# Patient Record
Sex: Male | Born: 1955 | Race: Black or African American | Hispanic: No | Smoking: Current every day smoker
Health system: Southern US, Community
[De-identification: ages and names within clinical notes are randomized; demographics above are authoritative.]

## PROBLEM LIST (undated history)

## (undated) DIAGNOSIS — F32A Depression, unspecified: Secondary | ICD-10-CM

## (undated) DIAGNOSIS — F329 Major depressive disorder, single episode, unspecified: Secondary | ICD-10-CM

## (undated) DIAGNOSIS — S2239XA Fracture of one rib, unspecified side, initial encounter for closed fracture: Secondary | ICD-10-CM

## (undated) DIAGNOSIS — I10 Essential (primary) hypertension: Secondary | ICD-10-CM

## (undated) DIAGNOSIS — S02609A Fracture of mandible, unspecified, initial encounter for closed fracture: Secondary | ICD-10-CM

## (undated) DIAGNOSIS — F1910 Other psychoactive substance abuse, uncomplicated: Secondary | ICD-10-CM

## (undated) DIAGNOSIS — S62101A Fracture of unspecified carpal bone, right wrist, initial encounter for closed fracture: Secondary | ICD-10-CM

## (undated) DIAGNOSIS — F1010 Alcohol abuse, uncomplicated: Secondary | ICD-10-CM

## (undated) DIAGNOSIS — D649 Anemia, unspecified: Secondary | ICD-10-CM

## (undated) HISTORY — DX: Major depressive disorder, single episode, unspecified: F32.9

## (undated) HISTORY — DX: Alcohol abuse, uncomplicated: F10.10

## (undated) HISTORY — DX: Other psychoactive substance abuse, uncomplicated: F19.10

## (undated) HISTORY — DX: Depression, unspecified: F32.A

---

## 2015-09-13 ENCOUNTER — Emergency Department (HOSPITAL_COMMUNITY): Payer: Medicaid Other

## 2015-09-13 ENCOUNTER — Inpatient Hospital Stay (HOSPITAL_COMMUNITY)
Admission: EM | Admit: 2015-09-13 | Discharge: 2015-09-19 | DRG: 957 | Disposition: A | Discharge status: Skilled Nursing Facility | Payer: Medicaid Other | Attending: Otolaryngology | Admitting: Otolaryngology

## 2015-09-13 ENCOUNTER — Encounter (HOSPITAL_COMMUNITY): Payer: Self-pay

## 2015-09-13 ENCOUNTER — Ambulatory Visit: Payer: Self-pay | Admitting: Otolaryngology

## 2015-09-13 DIAGNOSIS — F17210 Nicotine dependence, cigarettes, uncomplicated: Secondary | ICD-10-CM | POA: Diagnosis present

## 2015-09-13 DIAGNOSIS — S02652A Fracture of angle of left mandible, initial encounter for closed fracture: Secondary | ICD-10-CM | POA: Diagnosis present

## 2015-09-13 DIAGNOSIS — N179 Acute kidney failure, unspecified: Secondary | ICD-10-CM | POA: Diagnosis present

## 2015-09-13 DIAGNOSIS — S02609B Fracture of mandible, unspecified, initial encounter for open fracture: Secondary | ICD-10-CM

## 2015-09-13 DIAGNOSIS — D62 Acute posthemorrhagic anemia: Secondary | ICD-10-CM | POA: Diagnosis not present

## 2015-09-13 DIAGNOSIS — I493 Ventricular premature depolarization: Secondary | ICD-10-CM | POA: Diagnosis present

## 2015-09-13 DIAGNOSIS — S0181XA Laceration without foreign body of other part of head, initial encounter: Secondary | ICD-10-CM | POA: Diagnosis present

## 2015-09-13 DIAGNOSIS — S2243XA Multiple fractures of ribs, bilateral, initial encounter for closed fracture: Secondary | ICD-10-CM | POA: Diagnosis present

## 2015-09-13 DIAGNOSIS — I110 Hypertensive heart disease with heart failure: Secondary | ICD-10-CM | POA: Diagnosis present

## 2015-09-13 DIAGNOSIS — T17808A Unspecified foreign body in other parts of respiratory tract causing other injury, initial encounter: Secondary | ICD-10-CM | POA: Diagnosis present

## 2015-09-13 DIAGNOSIS — S02401A Maxillary fracture, unspecified, initial encounter for closed fracture: Secondary | ICD-10-CM | POA: Diagnosis present

## 2015-09-13 DIAGNOSIS — I4892 Unspecified atrial flutter: Secondary | ICD-10-CM | POA: Diagnosis not present

## 2015-09-13 DIAGNOSIS — S0242XA Fracture of alveolus of maxilla, initial encounter for closed fracture: Secondary | ICD-10-CM | POA: Diagnosis present

## 2015-09-13 DIAGNOSIS — I7101 Dissection of thoracic aorta: Secondary | ICD-10-CM | POA: Diagnosis present

## 2015-09-13 DIAGNOSIS — S62121A Displaced fracture of lunate [semilunar], right wrist, initial encounter for closed fracture: Secondary | ICD-10-CM | POA: Diagnosis present

## 2015-09-13 DIAGNOSIS — S02601A Fracture of unspecified part of body of right mandible, initial encounter for closed fracture: Principal | ICD-10-CM | POA: Diagnosis present

## 2015-09-13 DIAGNOSIS — Z23 Encounter for immunization: Secondary | ICD-10-CM

## 2015-09-13 DIAGNOSIS — E876 Hypokalemia: Secondary | ICD-10-CM | POA: Diagnosis present

## 2015-09-13 DIAGNOSIS — I472 Ventricular tachycardia: Secondary | ICD-10-CM | POA: Diagnosis not present

## 2015-09-13 DIAGNOSIS — S02602A Fracture of unspecified part of body of left mandible, initial encounter for closed fracture: Secondary | ICD-10-CM | POA: Diagnosis present

## 2015-09-13 DIAGNOSIS — I10 Essential (primary) hypertension: Secondary | ICD-10-CM | POA: Diagnosis present

## 2015-09-13 DIAGNOSIS — T17908A Unspecified foreign body in respiratory tract, part unspecified causing other injury, initial encounter: Secondary | ICD-10-CM

## 2015-09-13 DIAGNOSIS — Y903 Blood alcohol level of 60-79 mg/100 ml: Secondary | ICD-10-CM | POA: Diagnosis present

## 2015-09-13 DIAGNOSIS — I509 Heart failure, unspecified: Secondary | ICD-10-CM | POA: Diagnosis present

## 2015-09-13 DIAGNOSIS — I71019 Dissection of thoracic aorta, unspecified: Secondary | ICD-10-CM | POA: Diagnosis present

## 2015-09-13 DIAGNOSIS — S36031A Moderate laceration of spleen, initial encounter: Secondary | ICD-10-CM | POA: Diagnosis present

## 2015-09-13 DIAGNOSIS — Z590 Homelessness: Secondary | ICD-10-CM

## 2015-09-13 DIAGNOSIS — I4891 Unspecified atrial fibrillation: Secondary | ICD-10-CM | POA: Diagnosis not present

## 2015-09-13 DIAGNOSIS — S02609A Fracture of mandible, unspecified, initial encounter for closed fracture: Secondary | ICD-10-CM | POA: Diagnosis present

## 2015-09-13 DIAGNOSIS — R9431 Abnormal electrocardiogram [ECG] [EKG]: Secondary | ICD-10-CM | POA: Insufficient documentation

## 2015-09-13 DIAGNOSIS — S32311A Displaced avulsion fracture of right ilium, initial encounter for closed fracture: Secondary | ICD-10-CM | POA: Diagnosis present

## 2015-09-13 DIAGNOSIS — I7100 Dissection of unspecified site of aorta: Secondary | ICD-10-CM

## 2015-09-13 DIAGNOSIS — S32309A Unspecified fracture of unspecified ilium, initial encounter for closed fracture: Secondary | ICD-10-CM | POA: Diagnosis present

## 2015-09-13 DIAGNOSIS — R51 Headache: Secondary | ICD-10-CM | POA: Diagnosis present

## 2015-09-13 DIAGNOSIS — I959 Hypotension, unspecified: Secondary | ICD-10-CM | POA: Diagnosis present

## 2015-09-13 DIAGNOSIS — F10129 Alcohol abuse with intoxication, unspecified: Secondary | ICD-10-CM | POA: Diagnosis present

## 2015-09-13 DIAGNOSIS — I7103 Dissection of thoracoabdominal aorta: Secondary | ICD-10-CM | POA: Diagnosis present

## 2015-09-13 DIAGNOSIS — S2249XA Multiple fractures of ribs, unspecified side, initial encounter for closed fracture: Secondary | ICD-10-CM

## 2015-09-13 HISTORY — DX: Essential (primary) hypertension: I10

## 2015-09-13 LAB — I-STAT CHEM 8, ED
BUN: 14 mg/dL (ref 6–20)
CHLORIDE: 106 mmol/L (ref 101–111)
CREATININE: 1.6 mg/dL — AB (ref 0.61–1.24)
Calcium, Ion: 1.04 mmol/L — ABNORMAL LOW (ref 1.13–1.30)
Glucose, Bld: 97 mg/dL (ref 65–99)
HEMATOCRIT: 42 % (ref 39.0–52.0)
Hemoglobin: 14.3 g/dL (ref 13.0–17.0)
Potassium: 3.2 mmol/L — ABNORMAL LOW (ref 3.5–5.1)
SODIUM: 141 mmol/L (ref 135–145)
TCO2: 19 mmol/L (ref 0–100)

## 2015-09-13 LAB — CBC WITH DIFFERENTIAL/PLATELET
BASOS ABS: 0 10*3/uL (ref 0.0–0.1)
BASOS PCT: 0 %
EOS PCT: 1 %
Eosinophils Absolute: 0.1 10*3/uL (ref 0.0–0.7)
HCT: 39.6 % (ref 39.0–52.0)
Hemoglobin: 12.7 g/dL — ABNORMAL LOW (ref 13.0–17.0)
LYMPHS PCT: 10 %
Lymphs Abs: 1.1 10*3/uL (ref 0.7–4.0)
MCH: 29.2 pg (ref 26.0–34.0)
MCHC: 32.1 g/dL (ref 30.0–36.0)
MCV: 91 fL (ref 78.0–100.0)
MONO ABS: 0.3 10*3/uL (ref 0.1–1.0)
MONOS PCT: 3 %
NEUTROS ABS: 9.4 10*3/uL — AB (ref 1.7–7.7)
Neutrophils Relative %: 86 %
PLATELETS: 153 10*3/uL (ref 150–400)
RBC: 4.35 MIL/uL (ref 4.22–5.81)
RDW: 12.7 % (ref 11.5–15.5)
WBC: 11 10*3/uL — AB (ref 4.0–10.5)

## 2015-09-13 LAB — COMPREHENSIVE METABOLIC PANEL
ALBUMIN: 3.1 g/dL — AB (ref 3.5–5.0)
ALK PHOS: 44 U/L (ref 38–126)
ALT: 102 U/L — ABNORMAL HIGH (ref 17–63)
AST: 254 U/L — AB (ref 15–41)
Anion gap: 14 (ref 5–15)
BILIRUBIN TOTAL: 0.4 mg/dL (ref 0.3–1.2)
BUN: 14 mg/dL (ref 6–20)
CALCIUM: 8.5 mg/dL — AB (ref 8.9–10.3)
CO2: 17 mmol/L — ABNORMAL LOW (ref 22–32)
CREATININE: 1.48 mg/dL — AB (ref 0.61–1.24)
Chloride: 107 mmol/L (ref 101–111)
GFR calc Af Amer: 58 mL/min — ABNORMAL LOW (ref >60)
GFR, EST NON AFRICAN AMERICAN: 50 mL/min — AB (ref >60)
GLUCOSE: 102 mg/dL — AB (ref 65–99)
POTASSIUM: 3.1 mmol/L — AB (ref 3.5–5.1)
Sodium: 138 mmol/L (ref 135–145)
TOTAL PROTEIN: 6.3 g/dL — AB (ref 6.5–8.1)

## 2015-09-13 LAB — URINALYSIS, ROUTINE W REFLEX MICROSCOPIC
BILIRUBIN URINE: NEGATIVE
Bilirubin Urine: NEGATIVE
GLUCOSE, UA: NEGATIVE mg/dL
Glucose, UA: NEGATIVE mg/dL
Hgb urine dipstick: NEGATIVE
KETONES UR: NEGATIVE mg/dL
KETONES UR: 15 mg/dL — AB
LEUKOCYTES UA: NEGATIVE
Leukocytes, UA: NEGATIVE
NITRITE: NEGATIVE
Nitrite: NEGATIVE
PH: 5.5 (ref 5.0–8.0)
PH: 5 (ref 5.0–8.0)
PROTEIN: 30 mg/dL — AB
Protein, ur: NEGATIVE mg/dL
SPECIFIC GRAVITY, URINE: 1.041 — AB (ref 1.005–1.030)
Specific Gravity, Urine: 1.031 — ABNORMAL HIGH (ref 1.005–1.030)

## 2015-09-13 LAB — I-STAT CG4 LACTIC ACID, ED: Lactic Acid, Venous: 6.98 mmol/L (ref 0.5–2.0)

## 2015-09-13 LAB — LACTIC ACID, PLASMA: Lactic Acid, Venous: 1.3 mmol/L (ref 0.5–2.0)

## 2015-09-13 LAB — URINE MICROSCOPIC-ADD ON
BACTERIA UA: NONE SEEN
RBC / HPF: NONE SEEN RBC/hpf (ref 0–5)
WBC UA: NONE SEEN WBC/hpf (ref 0–5)

## 2015-09-13 LAB — ETHANOL: ALCOHOL ETHYL (B): 73 mg/dL — AB (ref <5)

## 2015-09-13 LAB — PROTIME-INR
INR: 1.34 (ref 0.00–1.49)
PROTHROMBIN TIME: 16.7 s — AB (ref 11.6–15.2)

## 2015-09-13 LAB — ABO/RH: ABO/RH(D): O POS

## 2015-09-13 LAB — CDS SEROLOGY

## 2015-09-13 MED ORDER — FENTANYL CITRATE (PF) 100 MCG/2ML IJ SOLN
50.0000 ug | INTRAMUSCULAR | Status: DC | PRN
  Administered 2015-09-13 – 2015-09-18 (×12): 50 ug via INTRAVENOUS
  Filled 2015-09-13 (×11): qty 2

## 2015-09-13 MED ORDER — ONDANSETRON HCL 4 MG PO TABS: 4.0000 mg | ORAL_TABLET | Freq: Four times a day (QID) | ORAL | Status: DC | PRN

## 2015-09-13 MED ORDER — SODIUM CHLORIDE 0.9 % IV BOLUS (SEPSIS)
1000.0000 mL | Freq: Once | INTRAVENOUS | Status: AC
  Administered 2015-09-13: 1000 mL via INTRAVENOUS

## 2015-09-13 MED ORDER — CEFAZOLIN SODIUM 1-5 GM-% IV SOLN
1.0000 g | Freq: Once | INTRAVENOUS | Status: AC
Start: 2015-09-13 — End: 2015-09-13
  Administered 2015-09-13: 1 g via INTRAVENOUS
  Filled 2015-09-13: qty 50

## 2015-09-13 MED ORDER — LABETALOL HCL 5 MG/ML IV SOLN
0.5000 mg/min | INTRAVENOUS | Status: DC
  Administered 2015-09-13 – 2015-09-15 (×2): 0.5 mg/min via INTRAVENOUS
  Administered 2015-09-15 – 2015-09-16 (×2): 1.5 mg/min via INTRAVENOUS
  Filled 2015-09-13 (×4): qty 100

## 2015-09-13 MED ORDER — ONDANSETRON HCL 4 MG/2ML IJ SOLN: 4.0000 mg | Freq: Four times a day (QID) | INTRAMUSCULAR | Status: DC | PRN

## 2015-09-13 MED ORDER — PANTOPRAZOLE SODIUM 40 MG PO TBEC
40.0000 mg | DELAYED_RELEASE_TABLET | Freq: Two times a day (BID) | ORAL | Status: DC
  Administered 2015-09-17 – 2015-09-18 (×3): 40 mg via ORAL
  Filled 2015-09-13 (×4): qty 1

## 2015-09-13 MED ORDER — FAMOTIDINE IN NACL 20-0.9 MG/50ML-% IV SOLN
20.0000 mg | Freq: Two times a day (BID) | INTRAVENOUS | Status: DC
  Administered 2015-09-14 – 2015-09-17 (×6): 20 mg via INTRAVENOUS
  Filled 2015-09-13 (×12): qty 50

## 2015-09-13 MED ORDER — ENOXAPARIN SODIUM 30 MG/0.3ML ~~LOC~~ SOLN
30.0000 mg | Freq: Two times a day (BID) | SUBCUTANEOUS | Status: DC
  Administered 2015-09-14 – 2015-09-19 (×11): 30 mg via SUBCUTANEOUS
  Filled 2015-09-13 (×11): qty 0.3

## 2015-09-13 MED ORDER — HYDROMORPHONE HCL 1 MG/ML IJ SOLN
0.5000 mg | INTRAMUSCULAR | Status: DC | PRN
  Administered 2015-09-14 – 2015-09-18 (×18): 0.5 mg via INTRAVENOUS
  Filled 2015-09-13 (×18): qty 1

## 2015-09-13 MED ORDER — IOPAMIDOL (ISOVUE-370) INJECTION 76%
INTRAVENOUS | Status: AC
  Administered 2015-09-13: 100 mL
  Filled 2015-09-13: qty 100

## 2015-09-13 MED ORDER — ACETAMINOPHEN 325 MG PO TABS: 650.0000 mg | ORAL_TABLET | ORAL | Status: DC | PRN

## 2015-09-13 MED ORDER — TETANUS-DIPHTH-ACELL PERTUSSIS 5-2.5-18.5 LF-MCG/0.5 IM SUSP
INTRAMUSCULAR | Status: AC
  Administered 2015-09-13: 0.5 mL via INTRAMUSCULAR
  Filled 2015-09-13: qty 0.5

## 2015-09-13 MED ORDER — CEFAZOLIN SODIUM-DEXTROSE 2-4 GM/100ML-% IV SOLN
INTRAVENOUS | Status: AC
  Filled 2015-09-13: qty 100

## 2015-09-13 MED ORDER — FENTANYL CITRATE (PF) 100 MCG/2ML IJ SOLN
INTRAMUSCULAR | Status: AC
  Administered 2015-09-13: 50 ug via INTRAVENOUS
  Filled 2015-09-13: qty 2

## 2015-09-13 MED ORDER — LIDOCAINE-EPINEPHRINE 1 %-1:100000 IJ SOLN
30.0000 mL | Freq: Once | INTRAMUSCULAR | Status: AC
  Administered 2015-09-13: 30 mL via INTRADERMAL
  Filled 2015-09-13: qty 1

## 2015-09-13 MED ORDER — TETANUS-DIPHTHERIA TOXOIDS TD 5-2 LFU IM INJ
0.5000 mL | INJECTION | Freq: Once | INTRAMUSCULAR | Status: DC
  Filled 2015-09-13: qty 0.5

## 2015-09-13 MED ORDER — FENTANYL CITRATE (PF) 100 MCG/2ML IJ SOLN: 50.0000 ug | Freq: Once | INTRAMUSCULAR | Status: DC

## 2015-09-13 MED ORDER — SODIUM CHLORIDE 0.9 % IV SOLN
INTRAVENOUS | Status: DC
  Administered 2015-09-13 – 2015-09-14 (×2): via INTRAVENOUS
  Administered 2015-09-14: 1000 mL via INTRAVENOUS
  Administered 2015-09-16 – 2015-09-17 (×2): via INTRAVENOUS

## 2015-09-13 MED ORDER — SODIUM CHLORIDE 0.9 % IV SOLN
INTRAVENOUS | Status: DC | PRN
  Administered 2015-09-17: 10 mL/h via INTRA_ARTERIAL
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
## 2015-09-13 NOTE — ED Notes (Signed)
Vista aspen collar applied to pt.

## 2015-09-13 NOTE — Procedures (Signed)
Arterial Catheter Insertion Procedure Note Luke Cook [id number] Apr 09, 1955  Procedure: Insertion of Arterial Catheter  Indications: Blood pressure monitoring  Procedure Details Consent: Unable to obtain consent because of altered level of consciousness. Time Out: Verified patient identification, verified procedure, site/side was marked, verified correct patient position, special equipment/implants available, medications/allergies/relevent history reviewed, required imaging and test results available.  Performed  Maximum sterile technique was used including antiseptics, cap, gloves, gown, hand hygiene and mask. Skin prep: Chlorhexidine; local anesthetic administered 20 gauge catheter was inserted into left radial artery using the Seldinger technique.  Evaluation Blood flow good; BP tracing good. Complications: No apparent complications.   [facility] 09/13/2015

## 2015-09-13 NOTE — Progress Notes (Signed)
Orthopedic Tech Progress Note Patient Details:  Luke Cook [birth year] [id number]  Ortho Devices Type of Ortho Device: Ace wrap, Velcro wrist splint Ortho Device/Splint Location: RUE Ortho Device/Splint Interventions: Ordered, Application   Jennye MoccasinHughes, [place] Kellison [name] 09/13/2015, 7:46 PM
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
## 2015-09-13 NOTE — ED Notes (Signed)
Dr. Pollyann Kennedyosen at the bedside
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
## 2015-09-13 NOTE — Consult Note (Signed)
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 mandibular body fracture involving multiple lower anterior teeth. 2. Left posterior mandibular body fracture. 3. Left maxilla fracture involving multiple tooth roots and 1 displaced tooth. 4. No skull fracture or intracranial hemorrhage. 5. No cervical spine fracture or subluxation. 6. 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 7. Cervical spine degenerative changes. Electronically Signed   By: Beckie SaltsSteven  Reid M.D.   On: 09/13/2015 17:44     ASSESSMENT / PLAN:  Foreign Body in RLL: The object appears metallic, likely a dental filling, and appears to be in the distal portion of the medial segment of the RLL. An attempt should be made at removal, but the greatest risk it imposes is post-obstructive pneumonia, and its removal is not urgent. Conventional flexible bronchoscopy is not likely to be successful in retrieving the object, and may require rigid bronchoscopy. However, if the patient is going to be intubated for surgery, if a large tube is used (8.0 ETT, recognize this is not likely if he is still on C-spine precautions), an attempt with video flexible bronchoscopy with a device such as an Arndt bronchial blocker to remove the object.  Plan to discuss with the daytime pulmonary medicine consult  service to further ascertain what other options may be available. Consultation with thoracic surgery may also be of benefit.   Jamie KatoAaron Michaela Broski, MD Pulmonary and Critical Care Medicine [facility] Pager: [phone]  09/13/2015, 10:39 PM

## 2015-09-13 NOTE — ED Notes (Signed)
Patient transported to CT 

## 2015-09-13 NOTE — ED Notes (Signed)
Dr Wakefield at the bedside.  

## 2015-09-13 NOTE — ED Notes (Signed)
RT notified to start pt.'s a-line and will start labetalol drip.

## 2015-09-13 NOTE — ED Notes (Signed)
Ortho tech applied right wrist splint at bedside .
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
## 2015-09-14 ENCOUNTER — Encounter (HOSPITAL_COMMUNITY): Admission: EM | Disposition: A | Discharge status: Skilled Nursing Facility | Payer: Self-pay | Source: Home / Self Care

## 2015-09-14 ENCOUNTER — Inpatient Hospital Stay (HOSPITAL_COMMUNITY): Payer: Medicaid Other | Admitting: Anesthesiology

## 2015-09-14 DIAGNOSIS — T17908A Unspecified foreign body in respiratory tract, part unspecified causing other injury, initial encounter: Secondary | ICD-10-CM

## 2015-09-14 HISTORY — PX: ORIF MANDIBULAR FRACTURE: SHX2127

## 2015-09-14 HISTORY — PX: FACIAL LACERATION REPAIR: SHX6589

## 2015-09-14 LAB — BASIC METABOLIC PANEL
Anion gap: 5 (ref 5–15)
BUN: 18 mg/dL (ref 6–20)
CALCIUM: 7.8 mg/dL — AB (ref 8.9–10.3)
CO2: 24 mmol/L (ref 22–32)
Chloride: 112 mmol/L — ABNORMAL HIGH (ref 101–111)
Creatinine, Ser: 1.41 mg/dL — ABNORMAL HIGH (ref 0.61–1.24)
GFR calc non Af Amer: 53 mL/min — ABNORMAL LOW (ref >60)
Glucose, Bld: 127 mg/dL — ABNORMAL HIGH (ref 65–99)
Potassium: 4.2 mmol/L (ref 3.5–5.1)
SODIUM: 141 mmol/L (ref 135–145)

## 2015-09-14 LAB — CBC
HCT: 31.6 % — ABNORMAL LOW (ref 39.0–52.0)
HEMOGLOBIN: 10 g/dL — AB (ref 13.0–17.0)
MCH: 29.1 pg (ref 26.0–34.0)
MCHC: 31.6 g/dL (ref 30.0–36.0)
MCV: 91.9 fL (ref 78.0–100.0)
Platelets: 134 10*3/uL — ABNORMAL LOW (ref 150–400)
RBC: 3.44 MIL/uL — AB (ref 4.22–5.81)
RDW: 13.2 % (ref 11.5–15.5)
WBC: 11.1 10*3/uL — ABNORMAL HIGH (ref 4.0–10.5)

## 2015-09-14 LAB — MRSA PCR SCREENING: MRSA BY PCR: NEGATIVE

## 2015-09-14 SURGERY — OPEN REDUCTION INTERNAL FIXATION (ORIF) MANDIBULAR FRACTURE
Anesthesia: General | Site: Mouth

## 2015-09-14 MED ORDER — NEOSTIGMINE METHYLSULFATE 10 MG/10ML IV SOLN
INTRAVENOUS | Status: DC | PRN
  Administered 2015-09-14: 2 mg via INTRAVENOUS

## 2015-09-14 MED ORDER — MIDAZOLAM HCL 2 MG/2ML IJ SOLN
INTRAMUSCULAR | Status: AC
  Filled 2015-09-14: qty 2

## 2015-09-14 MED ORDER — PHENYLEPHRINE HCL 10 MG/ML IJ SOLN
10.0000 mg | INTRAMUSCULAR | Status: DC | PRN
  Administered 2015-09-14: 60 ug/min via INTRAVENOUS

## 2015-09-14 MED ORDER — LIDOCAINE HCL (CARDIAC) 20 MG/ML IV SOLN
INTRAVENOUS | Status: DC | PRN
  Administered 2015-09-14: 50 mg via INTRATRACHEAL

## 2015-09-14 MED ORDER — DEXAMETHASONE SODIUM PHOSPHATE 10 MG/ML IJ SOLN
INTRAMUSCULAR | Status: AC
  Filled 2015-09-14: qty 1

## 2015-09-14 MED ORDER — FENTANYL CITRATE (PF) 250 MCG/5ML IJ SOLN
INTRAMUSCULAR | Status: AC
  Filled 2015-09-14: qty 5

## 2015-09-14 MED ORDER — EPHEDRINE SULFATE 50 MG/ML IJ SOLN
INTRAMUSCULAR | Status: DC | PRN
  Administered 2015-09-14: 15 mg via INTRAVENOUS

## 2015-09-14 MED ORDER — HYDROMORPHONE HCL 1 MG/ML IJ SOLN
0.2500 mg | INTRAMUSCULAR | Status: DC | PRN
  Administered 2015-09-14 (×2): 0.5 mg via INTRAVENOUS

## 2015-09-14 MED ORDER — LABETALOL HCL 5 MG/ML IV SOLN
INTRAVENOUS | Status: DC | PRN
  Administered 2015-09-14: 20 mg via INTRAVENOUS

## 2015-09-14 MED ORDER — PHENYLEPHRINE HCL 10 MG/ML IJ SOLN
INTRAMUSCULAR | Status: DC | PRN
  Administered 2015-09-14 (×2): 80 ug via INTRAVENOUS

## 2015-09-14 MED ORDER — 0.9 % SODIUM CHLORIDE (POUR BTL) OPTIME
TOPICAL | Status: DC | PRN
  Administered 2015-09-14: 1000 mL

## 2015-09-14 MED ORDER — LIDOCAINE 2% (20 MG/ML) 5 ML SYRINGE
INTRAMUSCULAR | Status: AC
  Filled 2015-09-14: qty 5

## 2015-09-14 MED ORDER — BACITRACIN ZINC 500 UNIT/GM EX OINT
TOPICAL_OINTMENT | CUTANEOUS | Status: DC | PRN
  Administered 2015-09-14: 1 via TOPICAL

## 2015-09-14 MED ORDER — PROPOFOL 10 MG/ML IV BOLUS
INTRAVENOUS | Status: DC | PRN
  Administered 2015-09-14: 170 mg via INTRAVENOUS

## 2015-09-14 MED ORDER — FENTANYL CITRATE (PF) 250 MCG/5ML IJ SOLN
INTRAMUSCULAR | Status: DC | PRN
Start: 2015-09-14 — End: 2015-09-14
  Administered 2015-09-14 (×3): 50 ug via INTRAVENOUS

## 2015-09-14 MED ORDER — EPHEDRINE 5 MG/ML INJ
INTRAVENOUS | Status: AC
  Filled 2015-09-14: qty 10

## 2015-09-14 MED ORDER — HYDROCODONE-ACETAMINOPHEN 7.5-325 MG/15ML PO SOLN
15.0000 mL | ORAL | Status: DC | PRN
  Administered 2015-09-16 – 2015-09-17 (×2): 15 mL via ORAL
  Filled 2015-09-14 (×2): qty 15

## 2015-09-14 MED ORDER — NEOSTIGMINE METHYLSULFATE 5 MG/5ML IV SOSY
PREFILLED_SYRINGE | INTRAVENOUS | Status: AC
  Filled 2015-09-14: qty 5

## 2015-09-14 MED ORDER — PROMETHAZINE HCL 25 MG/ML IJ SOLN: 6.2500 mg | INTRAMUSCULAR | Status: DC | PRN

## 2015-09-14 MED ORDER — PROPOFOL 10 MG/ML IV BOLUS
INTRAVENOUS | Status: AC
  Filled 2015-09-14: qty 20

## 2015-09-14 MED ORDER — ROCURONIUM BROMIDE 100 MG/10ML IV SOLN
INTRAVENOUS | Status: DC | PRN
  Administered 2015-09-14: 25 mg via INTRAVENOUS

## 2015-09-14 MED ORDER — SUCCINYLCHOLINE CHLORIDE 200 MG/10ML IV SOSY
PREFILLED_SYRINGE | INTRAVENOUS | Status: AC
  Filled 2015-09-14: qty 10

## 2015-09-14 MED ORDER — GLYCOPYRROLATE 0.2 MG/ML IJ SOLN
INTRAMUSCULAR | Status: DC | PRN
  Administered 2015-09-14: 0.4 mg via INTRAVENOUS

## 2015-09-14 MED ORDER — LACTATED RINGERS IV SOLN
INTRAVENOUS | Status: DC | PRN
  Administered 2015-09-14 (×3): via INTRAVENOUS

## 2015-09-14 MED ORDER — GLYCOPYRROLATE 0.2 MG/ML IV SOSY
PREFILLED_SYRINGE | INTRAVENOUS | Status: AC
  Filled 2015-09-14: qty 3

## 2015-09-14 MED ORDER — ESMOLOL HCL 100 MG/10ML IV SOLN
INTRAVENOUS | Status: DC | PRN
  Administered 2015-09-14 (×2): 30 mg via INTRAVENOUS
  Administered 2015-09-14: 40 mg via INTRAVENOUS

## 2015-09-14 MED ORDER — CLINDAMYCIN PHOSPHATE 900 MG/50ML IV SOLN
900.0000 mg | INTRAVENOUS | Status: AC
  Administered 2015-09-14: 900 mg via INTRAVENOUS
  Filled 2015-09-14: qty 50

## 2015-09-14 MED ORDER — ESMOLOL HCL 100 MG/10ML IV SOLN
INTRAVENOUS | Status: AC
  Filled 2015-09-14: qty 10

## 2015-09-14 MED ORDER — ONDANSETRON HCL 4 MG/2ML IJ SOLN
INTRAMUSCULAR | Status: DC | PRN
  Administered 2015-09-14: 4 mg via INTRAVENOUS

## 2015-09-14 MED ORDER — ONDANSETRON HCL 4 MG/2ML IJ SOLN
INTRAMUSCULAR | Status: AC
  Filled 2015-09-14: qty 2

## 2015-09-14 MED ORDER — CLINDAMYCIN PHOSPHATE 600 MG/50ML IV SOLN
600.0000 mg | Freq: Three times a day (TID) | INTRAVENOUS | Status: DC
  Administered 2015-09-14 – 2015-09-19 (×15): 600 mg via INTRAVENOUS
  Filled 2015-09-14 (×16): qty 50

## 2015-09-14 MED ORDER — ROCURONIUM BROMIDE 50 MG/5ML IV SOLN
INTRAVENOUS | Status: AC
  Filled 2015-09-14: qty 1

## 2015-09-14 MED ORDER — SUCCINYLCHOLINE CHLORIDE 20 MG/ML IJ SOLN
INTRAMUSCULAR | Status: DC | PRN
  Administered 2015-09-14: 120 mg via INTRAVENOUS

## 2015-09-14 MED ORDER — OXYMETAZOLINE HCL 0.05 % NA SOLN
NASAL | Status: AC
  Administered 2015-09-14 (×2): 2 via NASAL
  Filled 2015-09-14: qty 15

## 2015-09-14 MED ORDER — CLINDAMYCIN PHOSPHATE 900 MG/50ML IV SOLN
INTRAVENOUS | Status: AC
  Filled 2015-09-14: qty 50

## 2015-09-14 MED ORDER — PNEUMOCOCCAL VAC POLYVALENT 25 MCG/0.5ML IJ INJ
0.5000 mL | INJECTION | INTRAMUSCULAR | Status: AC
  Administered 2015-09-15: 0.5 mL via INTRAMUSCULAR
  Filled 2015-09-14: qty 0.5

## 2015-09-14 MED ORDER — DEXAMETHASONE SODIUM PHOSPHATE 10 MG/ML IJ SOLN
INTRAMUSCULAR | Status: DC | PRN
  Administered 2015-09-14: 10 mg via INTRAVENOUS

## 2015-09-14 MED ORDER — BACITRACIN ZINC 500 UNIT/GM EX OINT
TOPICAL_OINTMENT | Freq: Two times a day (BID) | CUTANEOUS | Status: DC
  Administered 2015-09-14 (×2): via TOPICAL
  Administered 2015-09-15: 1 via TOPICAL
  Administered 2015-09-16: 10:00:00 via TOPICAL
  Administered 2015-09-16 – 2015-09-17 (×2): 1 via TOPICAL
  Administered 2015-09-17 – 2015-09-19 (×4): via TOPICAL
  Filled 2015-09-14: qty 28.35

## 2015-09-14 SURGICAL SUPPLY — 47 items
BIT DRILL 1.9MM X 35MM (BIT) ×2 IMPLANT
BIT DRILL 1.9X35 (BIT) ×2
BLADE SURG 15 STRL LF DISP TIS (BLADE) IMPLANT
BLADE SURG 15 STRL SS (BLADE)
CANISTER SUCTION 2500CC (MISCELLANEOUS) ×4 IMPLANT
CLEANER TIP ELECTROSURG 2X2 (MISCELLANEOUS) ×4 IMPLANT
COVER SURGICAL LIGHT HANDLE (MISCELLANEOUS) ×4 IMPLANT
DRAPE PROXIMA HALF (DRAPES) ×4 IMPLANT
DRILL BIT 1.9MM X 35MM (BIT) ×2
ELECT COATED BLADE 2.86 ST (ELECTRODE) ×4 IMPLANT
ELECT NEEDLE TIP 2.8 STRL (NEEDLE) IMPLANT
ELECT REM PT RETURN 9FT ADLT (ELECTROSURGICAL)
ELECTRODE REM PT RTRN 9FT ADLT (ELECTROSURGICAL) IMPLANT
GLOVE BIOGEL PI IND STRL 7.5 (GLOVE) ×2 IMPLANT
GLOVE BIOGEL PI INDICATOR 7.5 (GLOVE) ×2
GLOVE ECLIPSE 7.5 STRL STRAW (GLOVE) ×4 IMPLANT
GLOVE SURG SS PI 7.0 STRL IVOR (GLOVE) ×8 IMPLANT
GOWN STRL REUS W/ TWL LRG LVL3 (GOWN DISPOSABLE) ×4 IMPLANT
GOWN STRL REUS W/TWL LRG LVL3 (GOWN DISPOSABLE) ×4
KIT BASIN OR (CUSTOM PROCEDURE TRAY) ×4 IMPLANT
KIT ROOM TURNOVER OR (KITS) ×4 IMPLANT
NEEDLE 27GAX1X1/2 (NEEDLE) ×4 IMPLANT
NS IRRIG 1000ML POUR BTL (IV SOLUTION) ×4 IMPLANT
PAD ARMBOARD 7.5X6 YLW CONV (MISCELLANEOUS) ×8 IMPLANT
PENCIL FOOT CONTROL (ELECTRODE) ×4 IMPLANT
PLATE 4 H MINI W/BAR (Plate) ×4 IMPLANT
PLATE HYBRID MMF (Plate) ×8 IMPLANT
PLATE MNDBLE FRACTURE 4H (Plate) ×4 IMPLANT
SCISSORS WIRE DISP (INSTRUMENTS) ×4 IMPLANT
SCREW BONE 2.7X8MM EMERG (Screw) ×4 IMPLANT
SCREW LOCK SELFDRIL 2.0X8M MMF (Screw) ×36 IMPLANT
SCREW LOCKING SELF DRILL 2.0X6 (Screw) ×16 IMPLANT
SCREW MNDBLE 2.3X12MM BONE (Screw) ×8 IMPLANT
SCREW MNDBLE 2.3X14MM BONE (Screw) ×4 IMPLANT
SCREW MNDBLE 2.3X16MM BONE (Screw) ×8 IMPLANT
SCREW MNDBLE 2.3X6MM BONE (Screw) ×16 IMPLANT
SUT CHROMIC 3 0 PS 2 (SUTURE) ×8 IMPLANT
SUT STEEL 0 (SUTURE)
SUT STEEL 0 18XMFL TIE 17 (SUTURE) IMPLANT
SUT STEEL 2 (SUTURE) IMPLANT
SUT STEEL 4 (SUTURE) IMPLANT
SUT VICRYL 4-0 PS2 18IN ABS (SUTURE) ×4 IMPLANT
TEMPLATE PLATE MANDIBUL NS 6H (Plate) ×4 IMPLANT
TOWEL OR 17X24 6PK STRL BLUE (TOWEL DISPOSABLE) ×4 IMPLANT
TRAY ENT MC OR (CUSTOM PROCEDURE TRAY) ×4 IMPLANT
WATER STERILE IRR 1000ML POUR (IV SOLUTION) ×4 IMPLANT
WIRE GUIDE MODEL 22X500MM (WIRE) ×4 IMPLANT

## 2015-09-14 NOTE — Op Note (Signed)
OPERATIVE REPORT  DATE OF SURGERY: 09/14/2015  PATIENT:  Brigitte PulseScottie T XXXPoke,  60 y.o. male  PRE-OPERATIVE DIAGNOSIS:  mandible fracture  POST-OPERATIVE DIAGNOSIS:  mandible fracture  PROCEDURE:  Procedure(s): OPEN REDUCTION INTERNAL FIXATION (ORIF) MANDIBULAR FRACTURE AND MAXILLOMANDIBULAR FIXATION AND OPEN REDUCTION INTERNAL FIXATION ALVEOLAR FRACTURE CHIN LACERATION REPAIR  SURGEON:  Susy FrizzleJefry H Bryttany Tortorelli, MD  ASSISTANTS: none  ANESTHESIA:   General   EBL:  50 ml  DRAINS: none  LOCAL MEDICATIONS USED:  None  SPECIMEN:  none  COUNTS:  Correct  PROCEDURE DETAILS: The patient was taken to the operating room and placed on the operating table in the supine position. Following induction of general nasotracheal  anesthesia, the face was prepped and draped in a standard fashion. A cheek retractor was used throughout the case. The oral cavity was inspected. There were 2 small fragments of amalgam in the oral cavity that were removed easily. The anterior maxillary dentition was all loosened and with an apparent alveolar fracture. There were multiple other loose teeth which I suspect were loose prior to the injury, especially the upper and lower molars. i was able to establish what appeared to be his normal occlusion. Hybrid arch bars were placed and secured in place with screws, upper and lower. The occlusion was established and the MMF was  Placed with 22-gauge wires, #3.  The staples were removed from the chin laceration. The laceration was extended towards the right side about 2 cm to facilitate exposure of the fractures. Cautery was used to dissected down to the mandible. There is a  Widely displaced right body fracture with comminuted segment of the inferior margin of the mentum. Fracture was reduced with a fracture reduction forceps.  The Leibinger mandibular plating system was used. A 2.7 6-hole plate was used to fixate the larger fragments. 2 screws were used on each  Side of the fracture. The  lower comminuted segment was then fixated to the left using a 4 hole miniplate and 4 screws. There was nice anatomic reduction and fixation. The wound was irrigated with saline.. Deep layer closure was accomplished using  3-0 Vicryl and running 3-0 chromic was used on the skin. The inner lower lip laceration was reapproximated with interrupted and running 3-0 chromic suture, and the external lower lip was also reapproximated with interrupted 3-0 chromic. The lip laceration was approximately 5 cm and the chin laceration was approximately 7 cm. Bacitracin was applied. The oral cavity was irrigated with saline and suctioned. The chin wound was also irrigated with saline prior to closure. The nasal tube was removed and the patient was transferred back to PACU in stable condition.    PATIENT DISPOSITION:  To PACU, stable

## 2015-09-14 NOTE — Consult Note (Signed)
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      Orthopaedic & Hand Surgery [facility] Orthopaedic & Sports Medicine [facility] [address] [place], [place]  [address] Office: [phone] Mobile: [phone]  09/14/2015, 3:38 AM
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
## 2015-09-14 NOTE — Anesthesia Postprocedure Evaluation (Signed)
Anesthesia Post Note  Patient: Mitchelle T XXXPoke  Procedure(s) Performed: Procedure(s) (LRB): OPEN REDUCTION INTERNAL FIXATION (ORIF) MANDIBULAR FRACTURE AND MAXILLOMANDIBULAR FIXATION AND OPEN REDUCTION INTERNAL FIXATION ALVEOLAR FRACTURE (N/A) CHIN LACERATION REPAIR (N/A)  Patient location during evaluation: PACU Anesthesia Type: General Level of consciousness: awake Pain management: pain level controlled Vital Signs Assessment: post-procedure vital signs reviewed and stable Respiratory status: spontaneous breathing Cardiovascular status: stable Anesthetic complications: no    Last Vitals:  Filed Vitals:   09/14/15 1100 09/14/15 1115  BP: 133/73 131/72  Pulse: 73 69  Temp: 36.7 C   Resp: 22 23    Last Pain:  Filed Vitals:   09/14/15 1116  PainSc: 6                  EDWARDS,Chloris Marcoux

## 2015-09-14 NOTE — Transfer of Care (Signed)
Immediate Anesthesia Transfer of Care Note  Patient: Luke Cook  Procedure(s) Performed: Procedure(s): OPEN REDUCTION INTERNAL FIXATION (ORIF) MANDIBULAR FRACTURE AND MAXILLOMANDIBULAR FIXATION AND OPEN REDUCTION INTERNAL FIXATION ALVEOLAR FRACTURE (N/A) CHIN LACERATION REPAIR (N/A)  Patient Location: PACU  Anesthesia Type:General  Level of Consciousness: awake and alert   Airway & Oxygen Therapy: Patient Spontanous Breathing and Patient connected to nasal cannula oxygen  Post-op Assessment: Report given to RN and Post -op Vital signs reviewed and stable  Post vital signs: Reviewed and stable  Last Vitals:  Filed Vitals:   09/14/15 0600 09/14/15 0700  BP: 126/62 110/64  Pulse: 65 59  Temp:    Resp: 26 20    Last Pain:  Filed Vitals:   09/14/15 0905  PainSc: 6          Complications: No apparent anesthesia complications and wire cutters given to PACU RN
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
## 2015-09-14 NOTE — Anesthesia Preprocedure Evaluation (Addendum)
Anesthesia Evaluation  Patient identified by MRN, date of birth, ID band Patient awake and Patient confused  General Assessment Comment:History noted. CE  Reviewed: Allergy & Precautions, NPO status , Patient's Chart, lab work & pertinent test results  Airway Mallampati: III   Neck ROM: Limited  Mouth opening: Limited Mouth Opening Comment: c collar on, unable to open mouth Dental  (+) Poor Dentition, Dental Advisory Given   Pulmonary Current Smoker,  Lung w/u noted.  CE    breath sounds clear to auscultation       Cardiovascular negative cardio ROS   Rhythm:Regular Rate:Normal     Neuro/Psych    GI/Hepatic negative GI ROS, Neg liver ROS,   Endo/Other  negative endocrine ROS  Renal/GU negative Renal ROS     Musculoskeletal   Abdominal   Peds  Hematology   Anesthesia Other Findings Discussed poss. Of post op ventilation  Reproductive/Obstetrics                           Anesthesia Physical Anesthesia Plan  ASA: II  Anesthesia Plan: General   Post-op Pain Management:    Induction: Intravenous  Airway Management Planned: Nasal ETT  Additional Equipment:   Intra-op Plan:   Post-operative Plan: Possible Post-op intubation/ventilation  Informed Consent: I have reviewed the patients History and Physical, chart, labs and discussed the procedure including the risks, benefits and alternatives for the proposed anesthesia with the patient or authorized representative who has indicated his/her understanding and acceptance.   Dental advisory given  Plan Discussed with: CRNA and Anesthesiologist  Anesthesia Plan Comments:         Anesthesia Quick Evaluation

## 2015-09-14 NOTE — ED Notes (Signed)
Attempted to call report

## 2015-09-14 NOTE — Interval H&P Note (Signed)
History and Physical Interval Note:  09/14/2015 8:01 AM  Luke Cook  has presented today for surgery, with the diagnosis of mandible fx  The various methods of treatment have been discussed with the patient and family. After consideration of risks, benefits and other options for treatment, the patient has consented to  Procedure(s): OPEN REDUCTION INTERNAL FIXATION (ORIF) MANDIBULAR FRACTURE AND MMF (N/A) as a surgical intervention .  The patient's history has been reviewed, patient examined, no change in status, stable for surgery.  I have reviewed the patient's chart and labs.  Questions were answered to the patient's satisfaction.     [name]

## 2015-09-14 NOTE — Progress Notes (Signed)
[facility]  S: S/P OR  O:  Filed Vitals:   09/14/15 0800 09/14/15 1100 09/14/15 1115 09/14/15 1130  BP:  133/73 131/72 118/72  Pulse:  73 69 67  Temp: 98.8 F (37.1 C) 98 F (36.7 C)  97.9 F (36.6 C)  TempSrc: Axillary     Resp:  22 23 11   Height:      Weight:      SpO2:  98% 100% 100%   3L [place]  Gen: resting comfortably HENT: mouth wired shut PULM: CTA B, normal effort CV: RRR, no mgr GI: BS+, soft, nontender Derm: no cyanosis or rash Psyche: normal mood and affect   CT images reviewed with Dr. Molli KnockYacoub> right lower lobe medial segment metal foreign body  Airway foreign body> needs bronchoscopy, but this will be difficult with his mouth wired shut.  Even a nasal bronchoscopy in this situation is sub-optimal.  Ideally we would like to get the metal out as soon as possible, so we will continue to check with ENT/Trauma about timing of his mandibular fixation.  Luke CarolinaBrent Ramir Malerba, MD [facility] Pager: [phone] Cell: [phone] After 3pm or if no response, call [phone]
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
## 2015-09-14 NOTE — Anesthesia Procedure Notes (Signed)
Procedure Name: Intubation Date/Time: 09/14/2015 8:52 AM Performed by: Alanda AmassFRIEDMAN, Jawanda Passey A Pre-anesthesia Checklist: Patient identified, Emergency Drugs available, Suction available, Patient being monitored and Timeout performed Patient Re-evaluated:Patient Re-evaluated prior to inductionOxygen Delivery Method: Circle System Utilized and Circle system utilized Preoxygenation: Pre-oxygenation with 100% oxygen Intubation Type: IV induction Ventilation: Mask ventilation without difficulty Laryngoscope Size: Glidescope Grade View: Grade I Nasal Tubes: Nasal Rae, Nasal prep performed and Right Tube size: 7.5 mm Number of attempts: 1 Airway Equipment and Method: Video-laryngoscopy Placement Confirmation: ETT inserted through vocal cords under direct vision,  positive ETCO2 and breath sounds checked- equal and bilateral Secured at: 28 cm Tube secured with: Tape Dental Injury: Teeth and Oropharynx as per pre-operative assessment  Comments: Nasal prep with NP airways, 8.0 then 8.5

## 2015-09-14 NOTE — Progress Notes (Signed)
Patient ID: Luke Cook, male   DOB: 10-27-1955, 60 y.o.   MRN: [id number] Appreciate Dr. Ulyses JarredMcQuaid's input. I spoke with Dr. Pollyann Kennedyosen and he is happy to coordinate wire removal for bronchoscopy. Fulls for now.  Violeta GelinasBurke Jaycey Gens, MD, MPH, FACS Trauma: [phone] General Surgery: [phone]  09/14/2015 1:06 PM

## 2015-09-15 ENCOUNTER — Encounter (HOSPITAL_COMMUNITY): Payer: Self-pay | Admitting: Cardiology

## 2015-09-15 DIAGNOSIS — I119 Hypertensive heart disease without heart failure: Secondary | ICD-10-CM

## 2015-09-15 DIAGNOSIS — S2249XK Multiple fractures of ribs, unspecified side, subsequent encounter for fracture with nonunion: Secondary | ICD-10-CM

## 2015-09-15 DIAGNOSIS — T17900A Unspecified foreign body in respiratory tract, part unspecified causing asphyxiation, initial encounter: Secondary | ICD-10-CM

## 2015-09-15 DIAGNOSIS — I472 Ventricular tachycardia: Secondary | ICD-10-CM

## 2015-09-15 DIAGNOSIS — J9811 Atelectasis: Secondary | ICD-10-CM

## 2015-09-15 DIAGNOSIS — R9431 Abnormal electrocardiogram [ECG] [EKG]: Secondary | ICD-10-CM

## 2015-09-15 DIAGNOSIS — I10 Essential (primary) hypertension: Secondary | ICD-10-CM | POA: Diagnosis present

## 2015-09-15 DIAGNOSIS — R0902 Hypoxemia: Secondary | ICD-10-CM

## 2015-09-15 LAB — BASIC METABOLIC PANEL
Anion gap: 6 (ref 5–15)
BUN: 16 mg/dL (ref 6–20)
CHLORIDE: 109 mmol/L (ref 101–111)
CO2: 23 mmol/L (ref 22–32)
Calcium: 8.1 mg/dL — ABNORMAL LOW (ref 8.9–10.3)
Creatinine, Ser: 1.34 mg/dL — ABNORMAL HIGH (ref 0.61–1.24)
GFR calc Af Amer: >60 mL/min (ref >60)
GFR calc non Af Amer: 56 mL/min — ABNORMAL LOW (ref >60)
GLUCOSE: 115 mg/dL — AB (ref 65–99)
POTASSIUM: 4.2 mmol/L (ref 3.5–5.1)
Sodium: 138 mmol/L (ref 135–145)

## 2015-09-15 LAB — MAGNESIUM: Magnesium: 1.9 mg/dL (ref 1.7–2.4)

## 2015-09-15 MED ORDER — METHOCARBAMOL 500 MG PO TABS: 500.0000 mg | ORAL_TABLET | Freq: Three times a day (TID) | ORAL | Status: DC | PRN
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
## 2015-09-16 ENCOUNTER — Inpatient Hospital Stay (HOSPITAL_BASED_OUTPATIENT_CLINIC_OR_DEPARTMENT_OTHER): Payer: Medicaid Other

## 2015-09-16 ENCOUNTER — Inpatient Hospital Stay (HOSPITAL_COMMUNITY): Payer: Medicaid Other

## 2015-09-16 ENCOUNTER — Encounter (HOSPITAL_COMMUNITY): Payer: Self-pay | Admitting: Otolaryngology

## 2015-09-16 DIAGNOSIS — S2243XA Multiple fractures of ribs, bilateral, initial encounter for closed fracture: Secondary | ICD-10-CM | POA: Diagnosis present

## 2015-09-16 DIAGNOSIS — R9431 Abnormal electrocardiogram [ECG] [EKG]: Secondary | ICD-10-CM

## 2015-09-16 DIAGNOSIS — R079 Chest pain, unspecified: Secondary | ICD-10-CM

## 2015-09-16 DIAGNOSIS — I7101 Dissection of thoracic aorta: Secondary | ICD-10-CM | POA: Diagnosis present

## 2015-09-16 DIAGNOSIS — I71019 Dissection of thoracic aorta, unspecified: Secondary | ICD-10-CM | POA: Diagnosis present

## 2015-09-16 DIAGNOSIS — S02609A Fracture of mandible, unspecified, initial encounter for closed fracture: Secondary | ICD-10-CM | POA: Diagnosis present

## 2015-09-16 LAB — NM MYOCAR MULTI W/SPECT W/WALL MOTION / EF
CHL CUP MPHR: 160 {beats}/min
CHL CUP NUCLEAR SDS: 1
CHL CUP NUCLEAR SRS: 6
CHL CUP RESTING HR STRESS: 73 {beats}/min
CSEPEDS: 36 s
CSEPEW: 1 METS
CSEPHR: 60 %
CSEPPHR: 97 {beats}/min
Exercise duration (min): 6 min
LV dias vol: 158 mL (ref 62–150)
LV sys vol: 86 mL
RATE: 0.41
SSS: 7
TID: 1.13

## 2015-09-16 MED ORDER — LABETALOL HCL 5 MG/ML IV SOLN
0.5000 mg/min | INTRAVENOUS | Status: DC
  Administered 2015-09-16: 1.5 mg/min via INTRAVENOUS
  Filled 2015-09-16 (×2): qty 100

## 2015-09-16 MED ORDER — TECHNETIUM TC 99M TETROFOSMIN IV KIT
10.0000 | PACK | Freq: Once | INTRAVENOUS | Status: AC | PRN
  Administered 2015-09-16: 10 via INTRAVENOUS

## 2015-09-16 MED ORDER — METOPROLOL TARTRATE 25 MG/10 ML ORAL SUSPENSION
25.0000 mg | Freq: Two times a day (BID) | ORAL | Status: DC
  Administered 2015-09-16 (×2): 25 mg via ORAL
  Filled 2015-09-16 (×3): qty 10

## 2015-09-16 MED ORDER — REGADENOSON 0.4 MG/5ML IV SOLN
INTRAVENOUS | Status: AC
  Filled 2015-09-16: qty 5

## 2015-09-16 MED ORDER — CETYLPYRIDINIUM CHLORIDE 0.05 % MT LIQD
7.0000 mL | Freq: Two times a day (BID) | OROMUCOSAL | Status: DC
  Administered 2015-09-16: 7 mL via OROMUCOSAL

## 2015-09-16 MED ORDER — REGADENOSON 0.4 MG/5ML IV SOLN
0.4000 mg | Freq: Once | INTRAVENOUS | Status: AC
  Administered 2015-09-16: 0.4 mg via INTRAVENOUS
  Filled 2015-09-16: qty 5

## 2015-09-16 MED ORDER — CHLORHEXIDINE GLUCONATE 0.12 % MT SOLN
15.0000 mL | Freq: Two times a day (BID) | OROMUCOSAL | Status: DC
  Administered 2015-09-16 – 2015-09-18 (×5): 15 mL via OROMUCOSAL
  Filled 2015-09-16 (×5): qty 15

## 2015-09-16 MED ORDER — ENSURE ENLIVE PO LIQD
237.0000 mL | Freq: Three times a day (TID) | ORAL | Status: DC
  Administered 2015-09-16 – 2015-09-18 (×6): 237 mL via ORAL

## 2015-09-16 MED ORDER — TECHNETIUM TC 99M TETROFOSMIN IV KIT
30.0000 | PACK | Freq: Once | INTRAVENOUS | Status: AC | PRN
  Administered 2015-09-16: 30 via INTRAVENOUS

## 2015-09-16 MED ORDER — HYDRALAZINE HCL 20 MG/ML IJ SOLN
10.0000 mg | INTRAMUSCULAR | Status: DC | PRN
  Administered 2015-09-16 – 2015-09-19 (×8): 10 mg via INTRAVENOUS
  Filled 2015-09-16 (×8): qty 1

## 2015-09-16 MED ORDER — WHITE PETROLATUM GEL
Status: AC
  Administered 2015-09-16: 0.2
  Filled 2015-09-16: qty 1

## 2015-09-16 NOTE — Progress Notes (Signed)
Repeat CTA done today.  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  The patient does not have end organ malperfusion or aneurysmal changes to his aorta, therefore he would best be managed with medical therapy, consistent with aggressive blood pressure control.  It is safe to discontinue continuous IV infusions for blood pressure control and use PO meds with IV supplementation.  He will need a repeat CTA in 3 months to evaluate for any changes.  I will arrange for this and schedule his follow up appointment.  Please contact me for any additional concerns.  Durene CalWells Cherysh Epperly

## 2015-09-16 NOTE — OR Nursing (Signed)
Addendum create to reflect correct Recovery Care Complete Time
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
## 2015-09-16 NOTE — Progress Notes (Signed)
Patient ID: Brigitte PulseScottie T XXXPoke, male   DOB: 1955/12/01, 60 y.o.   MRN: [id number] CTA C/A/P reviewed with radiologist. Chronic type B dissection down to femorals with good flow both lumens. 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 I D/W Dr. Myra GianottiBrabham from [facility] - plan D/C labetalol drip and control BP with oral meds plus IV pushes. Patient examined and I agree with the assessment and plan  Violeta GelinasBurke Gianfranco Araki, MD, MPH, FACS Trauma: [phone] General Surgery: [phone]  09/16/2015 2:11 PM

## 2015-09-16 NOTE — Progress Notes (Signed)
MV results:   There was no ST segment deviation noted during stress.  Defect 1: There is a medium defect of moderate severity.  This is a low risk study.  Nuclear stress EF: 45%.  No T wave inversion was noted during stress.  Medium size, moderate intensity fixed inferolateral perfusion defect with overlying bowel suggestive of attenuation artifact. No significant reversible ischemia. LVEF 45%, however, LV function appears qualitatively normal - consider limited echo to assess LV function, if not already performed. This is a low risk study.  MD to review in am. Limited echo for EF ordered.  Leanna BattlesBarrett, Pernella Ackerley, PA-C 09/16/2015 6:27 PM Beeper [phone]

## 2015-09-16 NOTE — Progress Notes (Signed)
Lexiscan MV performed, 1 day study, [facility] to read.  Theodore DemarkBarrett, Rhonda, Cordelia Poche-C 09/16/2015 2:16 PM Beeper [phone]

## 2015-09-17 ENCOUNTER — Other Ambulatory Visit (HOSPITAL_COMMUNITY): Payer: Self-pay

## 2015-09-17 ENCOUNTER — Other Ambulatory Visit: Payer: Self-pay | Admitting: *Deleted

## 2015-09-17 DIAGNOSIS — R9431 Abnormal electrocardiogram [ECG] [EKG]: Secondary | ICD-10-CM | POA: Insufficient documentation

## 2015-09-17 DIAGNOSIS — I7100 Dissection of unspecified site of aorta: Secondary | ICD-10-CM

## 2015-09-17 DIAGNOSIS — I7103 Dissection of thoracoabdominal aorta: Secondary | ICD-10-CM

## 2015-09-17 DIAGNOSIS — I110 Hypertensive heart disease with heart failure: Secondary | ICD-10-CM | POA: Diagnosis present

## 2015-09-17 DIAGNOSIS — I7101 Dissection of thoracic aorta: Secondary | ICD-10-CM

## 2015-09-17 LAB — BASIC METABOLIC PANEL
Anion gap: 6 (ref 5–15)
BUN: 12 mg/dL (ref 6–20)
CALCIUM: 7.9 mg/dL — AB (ref 8.9–10.3)
CO2: 21 mmol/L — AB (ref 22–32)
CREATININE: 1.12 mg/dL (ref 0.61–1.24)
Chloride: 111 mmol/L (ref 101–111)
GFR calc Af Amer: >60 mL/min (ref >60)
GLUCOSE: 115 mg/dL — AB (ref 65–99)
Potassium: 3.7 mmol/L (ref 3.5–5.1)
Sodium: 138 mmol/L (ref 135–145)

## 2015-09-17 LAB — TYPE AND SCREEN
ABO/RH(D): O POS
Antibody Screen: NEGATIVE
Unit division: 0
Unit division: 0

## 2015-09-17 LAB — CBC
HCT: 27.2 % — ABNORMAL LOW (ref 39.0–52.0)
Hemoglobin: 8.4 g/dL — ABNORMAL LOW (ref 13.0–17.0)
MCH: 27.9 pg (ref 26.0–34.0)
MCHC: 30.9 g/dL (ref 30.0–36.0)
MCV: 90.4 fL (ref 78.0–100.0)
PLATELETS: 160 10*3/uL (ref 150–400)
RBC: 3.01 MIL/uL — ABNORMAL LOW (ref 4.22–5.81)
RDW: 13 % (ref 11.5–15.5)
WBC: 6.9 10*3/uL (ref 4.0–10.5)

## 2015-09-17 MED ORDER — AMLODIPINE BESYLATE 5 MG PO TABS
2.5000 mg | ORAL_TABLET | Freq: Every day | ORAL | Status: DC
  Administered 2015-09-17: 2.5 mg via ORAL
  Filled 2015-09-17 (×3): qty 1

## 2015-09-17 MED ORDER — IOPAMIDOL (ISOVUE-370) INJECTION 76%
100.0000 mL | Freq: Once | INTRAVENOUS | Status: AC | PRN
  Administered 2015-09-16: 100 mL via INTRAVENOUS

## 2015-09-17 MED ORDER — METOPROLOL TARTRATE 25 MG/10 ML ORAL SUSPENSION
50.0000 mg | Freq: Two times a day (BID) | ORAL | Status: DC
  Administered 2015-09-17: 50 mg via ORAL
  Filled 2015-09-17: qty 20

## 2015-09-17 MED ORDER — METOPROLOL TARTRATE 25 MG/10 ML ORAL SUSPENSION
100.0000 mg | Freq: Two times a day (BID) | ORAL | Status: DC
  Administered 2015-09-17 – 2015-09-19 (×4): 100 mg via ORAL
  Filled 2015-09-17 (×4): qty 40

## 2015-09-17 NOTE — Progress Notes (Signed)
Report called and given to Candise BowensJen, RN on 2W
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
## 2015-09-17 NOTE — Progress Notes (Signed)
Orthopedic Tech Progress Note Patient Details:  Luke Cook 1955/08/02 [id number]  Luke Cook   [name] 09/17/2015, 10:24 AM
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
## 2015-09-17 NOTE — Progress Notes (Signed)
Patient arrived to 2W via wheelchair in no apparent distress.  Patient was oriented to unit and room to include call light and phone.  Telemetry monitor was applied and CCMD notified.  Will continue to monitor.
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
## 2015-09-18 ENCOUNTER — Inpatient Hospital Stay (HOSPITAL_COMMUNITY): Payer: Medicaid Other

## 2015-09-18 DIAGNOSIS — S62121A Displaced fracture of lunate [semilunar], right wrist, initial encounter for closed fracture: Secondary | ICD-10-CM | POA: Diagnosis present

## 2015-09-18 DIAGNOSIS — D62 Acute posthemorrhagic anemia: Secondary | ICD-10-CM | POA: Diagnosis not present

## 2015-09-18 DIAGNOSIS — S02401A Maxillary fracture, unspecified, initial encounter for closed fracture: Secondary | ICD-10-CM | POA: Diagnosis present

## 2015-09-18 DIAGNOSIS — R9431 Abnormal electrocardiogram [ECG] [EKG]: Secondary | ICD-10-CM

## 2015-09-18 DIAGNOSIS — S32309A Unspecified fracture of unspecified ilium, initial encounter for closed fracture: Secondary | ICD-10-CM | POA: Diagnosis present

## 2015-09-18 DIAGNOSIS — I480 Paroxysmal atrial fibrillation: Secondary | ICD-10-CM

## 2015-09-18 LAB — BASIC METABOLIC PANEL
Anion gap: 9 (ref 5–15)
BUN: 11 mg/dL (ref 6–20)
CHLORIDE: 109 mmol/L (ref 101–111)
CO2: 23 mmol/L (ref 22–32)
Calcium: 8.2 mg/dL — ABNORMAL LOW (ref 8.9–10.3)
Creatinine, Ser: 1.06 mg/dL (ref 0.61–1.24)
GFR calc Af Amer: >60 mL/min (ref >60)
GFR calc non Af Amer: >60 mL/min (ref >60)
Glucose, Bld: 110 mg/dL — ABNORMAL HIGH (ref 65–99)
POTASSIUM: 3.5 mmol/L (ref 3.5–5.1)
SODIUM: 141 mmol/L (ref 135–145)

## 2015-09-18 LAB — ECHOCARDIOGRAM COMPLETE
EERAT: 8.07
EWDT: 146 ms
FS: 30 % (ref 28–44)
Height: 70 in
IVS/LV PW RATIO, ED: 1.12
LA diam index: 1.43 cm/m2
LASIZE: 29 mm
LAVOL: 53.7 mL
LAVOLA4C: 39.4 mL
LAVOLIN: 26.5 mL/m2
LEFT ATRIUM END SYS DIAM: 29 mm
LV E/e' medial: 8.07
LV E/e'average: 8.07
LV PW d: 13.9 mm — AB (ref 0.6–1.1)
LV SIMPSON'S DISK: 54
LV TDI E'LATERAL: 11.3
LV e' LATERAL: 11.3 cm/s
LV sys vol index: 24 mL/m2
LVDIAVOL: 106 mL (ref 62–150)
LVDIAVOLIN: 52 mL/m2
LVOT area: 4.15 cm2
LVOTD: 23 mm
LVSYSVOL: 48 mL (ref 21–61)
MV Dec: 146
MV pk E vel: 91.2 m/s
MVPG: 3 mmHg
MVPKAVEL: 66.1 m/s
RV TAPSE: 17.6 mm
Stroke v: 58 ml
TDI e' medial: 11.5
Weight: 3005.31 oz

## 2015-09-18 MED ORDER — POLYETHYLENE GLYCOL 3350 17 G PO PACK: 17.0000 g | PACK | Freq: Every day | ORAL | Status: DC

## 2015-09-18 MED ORDER — AMLODIPINE BESYLATE 5 MG PO TABS
5.0000 mg | ORAL_TABLET | Freq: Every day | ORAL | Status: DC
  Administered 2015-09-18: 5 mg via ORAL

## 2015-09-18 MED ORDER — DILTIAZEM 12 MG/ML ORAL SUSPENSION
60.0000 mg | Freq: Four times a day (QID) | ORAL | Status: DC
  Administered 2015-09-18 – 2015-09-19 (×6): 60 mg via ORAL
  Filled 2015-09-18 (×7): qty 6

## 2015-09-18 MED ORDER — HYDROCODONE-ACETAMINOPHEN 7.5-325 MG/15ML PO SOLN
10.0000 mL | ORAL | Status: DC | PRN
  Administered 2015-09-18: 20 mL via ORAL
  Administered 2015-09-18 – 2015-09-19 (×3): 15 mL via ORAL
  Filled 2015-09-18 (×2): qty 15
  Filled 2015-09-18 (×2): qty 30
  Filled 2015-09-18 (×2): qty 15

## 2015-09-18 MED ORDER — HYDROMORPHONE HCL 1 MG/ML IJ SOLN: 0.5000 mg | INTRAMUSCULAR | Status: DC | PRN

## 2015-09-18 MED ORDER — DILTIAZEM HCL ER COATED BEADS 240 MG PO CP24: 240.0000 mg | ORAL_CAPSULE | Freq: Every day | ORAL | Status: DC
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
## 2015-09-18 NOTE — Progress Notes (Signed)
  Echocardiogram 2D Echocardiogram has been performed.  Luke Cook, Luke Cook M 09/18/2015, 2:05 PM
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
## 2015-09-19 DIAGNOSIS — I517 Cardiomegaly: Secondary | ICD-10-CM

## 2015-09-19 MED ORDER — HYDRALAZINE HCL 25 MG PO TABS: 25.0000 mg | ORAL_TABLET | Freq: Three times a day (TID) | ORAL | Status: DC

## 2015-09-19 MED ORDER — CLINDAMYCIN PHOSPHATE 600 MG/50ML IV SOLN: 600.0000 mg | Freq: Three times a day (TID) | INTRAVENOUS | Status: DC

## 2015-09-19 MED ORDER — CLINDAMYCIN HCL 300 MG PO CAPS: 300.0000 mg | ORAL_CAPSULE | Freq: Three times a day (TID) | ORAL | Status: DC

## 2015-09-19 MED ORDER — HYDROCODONE-ACETAMINOPHEN 7.5-325 MG/15ML PO SOLN: 10.0000 mL | Freq: Four times a day (QID) | ORAL | Status: DC | PRN

## 2015-09-19 MED ORDER — BOOST / RESOURCE BREEZE PO LIQD
1.0000 | Freq: Three times a day (TID) | ORAL | Status: DC
  Administered 2015-09-19: 1 via ORAL

## 2015-09-19 MED ORDER — DILTIAZEM HCL 60 MG PO TABS: 60.0000 mg | ORAL_TABLET | Freq: Three times a day (TID) | ORAL | Status: DC

## 2015-09-19 MED ORDER — METOPROLOL TARTRATE 25 MG/10 ML ORAL SUSPENSION: 100.0000 mg | Freq: Two times a day (BID) | ORAL | Status: DC

## 2015-09-19 MED ORDER — ENSURE ENLIVE PO LIQD: 237.0000 mL | Freq: Three times a day (TID) | ORAL | Status: DC
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
## 2015-09-19 NOTE — Clinical Social Work Placement (Signed)
   CLINICAL SOCIAL WORK PLACEMENT  NOTE  Date:  09/19/2015  Patient Details  Name: Luke Cook MRN: [id number] Date of Birth: 22-Aug-1955  Clinical Social Work is seeking post-discharge placement for this patient at the Skilled  Nursing Facility level of care (*CSW will initial, date and re-position this form in  chart as items are completed):  Yes   Patient/family provided with [place] Clinical Social Work Department's list of facilities offering this level of care within the geographic area requested by the patient (or if unable, by the patient's family).  Yes   Patient/family informed of their freedom to choose among providers that offer the needed level of care, that participate in Medicare, Medicaid or managed care program needed by the patient, have an available bed and are willing to accept the patient.  Yes   Patient/family informed of [facility]'s ownership interest in [facility] and [facility], as well as of the fact that they are under no obligation to receive care at these facilities.  PASRR submitted to EDS on 09/19/15     PASRR number received on 09/19/15     Existing PASRR number confirmed on       FL2 transmitted to all facilities in geographic area requested by pt/family on 09/19/15     FL2 transmitted to all facilities within larger geographic area on       Patient informed that his/her managed care company has contracts with or will negotiate with certain facilities, including the following:            Patient/family informed of bed offers received.  Patient chooses bed at       Physician recommends and patient chooses bed at      Patient to be transferred to   on  .  Patient to be transferred to facility by       Patient family notified on   of transfer.  Name of family member notified:        PHYSICIAN Please sign FL2     Additional Comment:    Macario GoldsJesse Chika Cichowski, LCSW [phone]
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
## 2015-09-19 NOTE — Care Management Note (Signed)
Case Management Note  Patient Details  Name: Luke Cook MRN: [id number] Date of Birth: [birth year]  Subjective/Objective:  Pt medically stable for dc today.                    Action/Plan: Pt discharging to SNF today, per CSW arrangements.    Expected Discharge Date:  09/19/15 Expected Discharge Plan:  Skilled Nursing Facility  In-House Referral:  Clinical Social Work  Discharge planning Services  CM Consult  Post Acute Care Choice:    Choice offered to:     DME Arranged:    DME Agency:     HH Arranged:    HH Agency:     Status of Service:  Completed, signed off  Medicare Important Message Given:    Date Medicare IM Given:    Medicare IM give by:    Date Additional Medicare IM Given:    Additional Medicare Important Message give by:     If discussed at Long Length of Stay Meetings, dates discussed:    Additional Comments:  Quintella BatonJulie W. Nylan Nakatani, RN, BSN  Trauma/Neuro ICU Case Manager [phone]
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
## 2015-09-19 NOTE — Clinical Social Work Note (Signed)
Clinical Social Worker facilitated patient discharge including contacting patient and facility to confirm patient discharge plans.  Clinical information faxed to facility and patient agreeable with plan.  Patient plans to notify family of transfer.  CSW arranged [facility].  RN to call report prior to discharge.  Clinical Social Worker will sign off for now as social work intervention is no longer needed. Please consult us again if new need arises.  Macario GoldsJesse Laniqua [place], [place] [phone]

## 2015-09-19 NOTE — Clinical Social Work Placement (Signed)
   CLINICAL SOCIAL WORK PLACEMENT  NOTE  Date:  09/19/2015  Patient Details  Name: Luke Cook MRN: [id number] Date of Birth: [birth year]  Clinical Social Work is seeking post-discharge placement for this patient at the Skilled  Nursing Facility level of care (*CSW will initial, date and re-position this form in  chart as items are completed):  Yes   Patient/family provided with [place] Clinical Social Work Department's list of facilities offering this level of care within the geographic area requested by the patient (or if unable, by the patient's family).  Yes   Patient/family informed of their freedom to choose among providers that offer the needed level of care, that participate in Medicare, Medicaid or managed care program needed by the patient, have an available bed and are willing to accept the patient.  Yes   Patient/family informed of [place]'s ownership interest in [facility] and [facility], as well as of the fact that they are under no obligation to receive care at these facilities.  PASRR submitted to EDS on 09/19/15     PASRR number received on 09/19/15     Existing PASRR number confirmed on       FL2 transmitted to all facilities in geographic area requested by pt/family on 09/19/15     FL2 transmitted to all facilities within larger geographic area on       Patient informed that his/her managed care company has contracts with or will negotiate with certain facilities, including the following:        Yes   Patient/family informed of bed offers received.  [facility]     Physician recommends and patient chooses bed at      [facility] 09/19/15.  Patient to be transferred to facility by Ambulance     Patient family notified on 09/19/15 of transfer.  Name of family member notified:  Patient to notify family      PHYSICIAN Please sign FL2     Additional Comment:   Luke GoldsJesse Jenny Omdahl, LCSW [phone]

## 2015-09-22 ENCOUNTER — Encounter: Payer: Self-pay | Admitting: Adult Health

## 2015-09-22 ENCOUNTER — Non-Acute Institutional Stay (SKILLED_NURSING_FACILITY): Payer: Medicaid Other | Admitting: Adult Health

## 2015-09-22 ENCOUNTER — Telehealth: Payer: Self-pay | Admitting: Cardiology

## 2015-09-22 DIAGNOSIS — D62 Acute posthemorrhagic anemia: Secondary | ICD-10-CM | POA: Diagnosis not present

## 2015-09-22 DIAGNOSIS — S02609S Fracture of mandible, unspecified, sequela: Secondary | ICD-10-CM | POA: Diagnosis not present

## 2015-09-22 DIAGNOSIS — S2243XS Multiple fractures of ribs, bilateral, sequela: Secondary | ICD-10-CM | POA: Diagnosis not present

## 2015-09-22 DIAGNOSIS — I71019 Dissection of thoracic aorta, unspecified: Secondary | ICD-10-CM

## 2015-09-22 DIAGNOSIS — I7101 Dissection of thoracic aorta: Secondary | ICD-10-CM

## 2015-09-22 DIAGNOSIS — I110 Hypertensive heart disease with heart failure: Secondary | ICD-10-CM | POA: Diagnosis not present
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
## 2015-09-22 NOTE — Telephone Encounter (Signed)
New message   [facility] calling - patient was seen in consult by Dr.  Delton SeeNelson 6.10.2017    Pt C/O medication issue:  1. Name of Medication: Apresoline   2. How are you currently taking this medication (dosage and times per day)? 25 mg   3. Are you having a reaction (difficulty breathing--STAT)? No   4. What is your medication issue? Can not take pill  - jaw wire shut . Unable to get liquid medication- please advise .

## 2015-09-22 NOTE — Telephone Encounter (Addendum)
DISCUSSED WITH  PHARMACISTS  MED MAY BE CRUSHED  AND  GIVEN  WITH OTHER LIQUID MEDS OR  JUICE  .CAREGIVER  AWARE AND IS UNCERTAIN IF ALL OF MED  CAN BE DELIVERED VIA  THAT  ROUTE . WILL TRY  AND IF  UNABLE WILL   CALL BACK WITH UPDATE   .Zack Seal/CY

## 2015-09-23 ENCOUNTER — Non-Acute Institutional Stay (SKILLED_NURSING_FACILITY): Payer: Medicaid Other | Admitting: Internal Medicine

## 2015-09-23 ENCOUNTER — Encounter: Payer: Self-pay | Admitting: Internal Medicine

## 2015-09-23 DIAGNOSIS — S2243XS Multiple fractures of ribs, bilateral, sequela: Secondary | ICD-10-CM

## 2015-09-23 DIAGNOSIS — S62121A Displaced fracture of lunate [semilunar], right wrist, initial encounter for closed fracture: Secondary | ICD-10-CM | POA: Diagnosis not present

## 2015-09-23 DIAGNOSIS — D62 Acute posthemorrhagic anemia: Secondary | ICD-10-CM

## 2015-09-23 DIAGNOSIS — S02609S Fracture of mandible, unspecified, sequela: Secondary | ICD-10-CM

## 2015-09-23 DIAGNOSIS — T17808A Unspecified foreign body in other parts of respiratory tract causing other injury, initial encounter: Secondary | ICD-10-CM | POA: Diagnosis not present

## 2015-09-23 DIAGNOSIS — I110 Hypertensive heart disease with heart failure: Secondary | ICD-10-CM | POA: Diagnosis not present

## 2015-09-23 DIAGNOSIS — I7101 Dissection of thoracic aorta: Secondary | ICD-10-CM

## 2015-09-23 DIAGNOSIS — I71019 Dissection of thoracic aorta, unspecified: Secondary | ICD-10-CM
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
## 2015-09-24 LAB — HEPATIC FUNCTION PANEL
ALK PHOS: 56 U/L (ref 25–125)
ALT: 15 U/L (ref 10–40)
AST: 15 U/L (ref 14–40)
BILIRUBIN, TOTAL: 0.5 mg/dL

## 2015-09-24 LAB — BASIC METABOLIC PANEL
BUN: 15 mg/dL (ref 4–21)
Creatinine: 1 mg/dL (ref 0.6–1.3)
GLUCOSE: 123 mg/dL
POTASSIUM: 5.2 mmol/L (ref 3.4–5.3)
SODIUM: 138 mmol/L (ref 137–147)

## 2015-09-24 LAB — CBC AND DIFFERENTIAL
HEMATOCRIT: 28 % — AB (ref 41–53)
HEMOGLOBIN: 9.1 g/dL — AB (ref 13.5–17.5)
Platelets: 619 10*3/uL — AB (ref 150–399)
WBC: 7.5 10^3/mL

## 2015-09-26 ENCOUNTER — Telehealth (HOSPITAL_COMMUNITY): Payer: Self-pay

## 2015-09-30 NOTE — Telephone Encounter (Signed)
NA, unable to leave msg

## 2015-10-01 ENCOUNTER — Telehealth (HOSPITAL_COMMUNITY): Payer: Self-pay

## 2015-10-01 NOTE — Telephone Encounter (Signed)
Addressed in other message.

## 2015-10-01 NOTE — Telephone Encounter (Signed)
Again NA, unable to leave message. She called again this morning saying she hadn't been called back. I left word with secretary to have pt f/u with Dr. Pollyann Kennedyosen and left his phone number.

## 2015-10-06 ENCOUNTER — Telehealth (HOSPITAL_COMMUNITY): Payer: Self-pay

## 2015-10-08 NOTE — Telephone Encounter (Signed)
Dr. Lindie SpruceWyatt spoke to Dr. Pollyann Kennedyosen who will resolve situation.

## 2015-10-15 ENCOUNTER — Ambulatory Visit: Payer: Self-pay | Admitting: Otolaryngology

## 2015-10-15 ENCOUNTER — Ambulatory Visit (HOSPITAL_COMMUNITY)
Admission: RE | Admit: 2015-10-15 | Discharge: 2015-10-15 | Disposition: A | Discharge status: Home / Self Care | Payer: Medicaid Other | Source: Ambulatory Visit | Attending: Otolaryngology | Admitting: Otolaryngology

## 2015-10-15 ENCOUNTER — Other Ambulatory Visit (HOSPITAL_COMMUNITY): Payer: Self-pay | Admitting: Otolaryngology

## 2015-10-15 DIAGNOSIS — S02641D Fracture of ramus of right mandible, subsequent encounter for fracture with routine healing: Secondary | ICD-10-CM | POA: Diagnosis present

## 2015-10-15 DIAGNOSIS — Z9889 Other specified postprocedural states: Secondary | ICD-10-CM | POA: Diagnosis not present
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
## 2015-10-17 ENCOUNTER — Encounter (HOSPITAL_COMMUNITY): Payer: Self-pay | Admitting: *Deleted

## 2015-10-17 NOTE — Pre-Procedure Instructions (Signed)
    Luke Cook  10/17/2015     No Pharmacies Listed   Your procedure is scheduled on Monday, October 20, 2015  [facility]  Call this number if you have problems the morning of surgery:  [phone]   Remember:  Do not eat food or drink liquids after midnight Sunday, October 19, 2015  Take these medicines the morning of surgery with A SIP OF WATER :diltiazem (CARDIZEM), hydrALAZINE (APRESOLINE), metoprolol tartrate (LOPRESSOR),  if needed: HYDROcodone-acetaminophen (HYCET) for pain Stop taking vitamins, fish oil and herbal medications/supplements  Do not wear jewelry, make-up or nail polish.  Do not wear lotions, powders, or perfumes.  You may not wear deoderant.  Do not shave 48 hours prior to surgery.  Men may shave face and neck.  Do not bring valuables to the hospital.  [facility] not [facility] any belongings or valuables.  Contacts, dentures or bridgework may not be worn into surgery.  Leave your suitcase in the car.  After surgery it may be brought to your room.  For patients admitted to the hospital, discharge time will be determined by your treatment team.  Patients discharged the day of surgery will not be allowed to drive home.   Name and phone number of your driver:
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
## 2015-10-17 NOTE — Progress Notes (Signed)
Pt SDW-Pre-op call completed by both pt and pt nurse, Clement SayresMariam Fadiga, LPN of [facility]. Pt denies SOB, chest pain, and being under the care of a cardiologist. Pt denies having a cardiac cath.  Nurse confirmed receipt of fax as well as reviewed pre-op instructions on phone. Nurse verbalized understanding of all pre-op instructions.

## 2015-10-19 MED ORDER — CLINDAMYCIN PHOSPHATE 900 MG/50ML IV SOLN: 900.0000 mg | INTRAVENOUS | Status: DC

## 2015-10-20 ENCOUNTER — Encounter (HOSPITAL_COMMUNITY): Payer: Self-pay | Admitting: Urology

## 2015-10-20 ENCOUNTER — Encounter (HOSPITAL_COMMUNITY)
Admission: RE | Disposition: A | Discharge status: Home / Self Care | Payer: Self-pay | Source: Ambulatory Visit | Attending: Otolaryngology

## 2015-10-20 ENCOUNTER — Ambulatory Visit (HOSPITAL_COMMUNITY)
Admission: RE | Admit: 2015-10-20 | Discharge: 2015-10-20 | Disposition: A | Discharge status: Home / Self Care | Payer: Medicaid Other | Source: Ambulatory Visit | Attending: Otolaryngology | Admitting: Otolaryngology

## 2015-10-20 ENCOUNTER — Ambulatory Visit (HOSPITAL_COMMUNITY): Payer: Medicaid Other | Admitting: Emergency Medicine

## 2015-10-20 DIAGNOSIS — F17200 Nicotine dependence, unspecified, uncomplicated: Secondary | ICD-10-CM | POA: Diagnosis not present

## 2015-10-20 DIAGNOSIS — I10 Essential (primary) hypertension: Secondary | ICD-10-CM | POA: Diagnosis not present

## 2015-10-20 DIAGNOSIS — Z472 Encounter for removal of internal fixation device: Secondary | ICD-10-CM | POA: Insufficient documentation

## 2015-10-20 HISTORY — PX: MANDIBULAR HARDWARE REMOVAL: SHX5205

## 2015-10-20 HISTORY — DX: Fracture of mandible, unspecified, initial encounter for closed fracture: S02.609A

## 2015-10-20 HISTORY — DX: Fracture of unspecified carpal bone, right wrist, initial encounter for closed fracture: S62.101A

## 2015-10-20 HISTORY — DX: Anemia, unspecified: D64.9

## 2015-10-20 HISTORY — DX: Fracture of one rib, unspecified side, initial encounter for closed fracture: S22.39XA

## 2015-10-20 LAB — BASIC METABOLIC PANEL
Anion gap: 5 (ref 5–15)
BUN: 29 mg/dL — AB (ref 6–20)
CHLORIDE: 104 mmol/L (ref 101–111)
CO2: 28 mmol/L (ref 22–32)
Calcium: 8.8 mg/dL — ABNORMAL LOW (ref 8.9–10.3)
Creatinine, Ser: 1.12 mg/dL (ref 0.61–1.24)
GFR calc Af Amer: >60 mL/min (ref >60)
GFR calc non Af Amer: >60 mL/min (ref >60)
GLUCOSE: 104 mg/dL — AB (ref 65–99)
POTASSIUM: 3.7 mmol/L (ref 3.5–5.1)
Sodium: 137 mmol/L (ref 135–145)

## 2015-10-20 LAB — CBC
HCT: 32.3 % — ABNORMAL LOW (ref 39.0–52.0)
HEMOGLOBIN: 10.1 g/dL — AB (ref 13.0–17.0)
MCH: 27.9 pg (ref 26.0–34.0)
MCHC: 31.3 g/dL (ref 30.0–36.0)
MCV: 89.2 fL (ref 78.0–100.0)
Platelets: 255 10*3/uL (ref 150–400)
RBC: 3.62 MIL/uL — AB (ref 4.22–5.81)
RDW: 12.8 % (ref 11.5–15.5)
WBC: 4.7 10*3/uL (ref 4.0–10.5)

## 2015-10-20 SURGERY — REMOVAL, HARDWARE, MANDIBLE
Anesthesia: Monitor Anesthesia Care | Site: Mouth

## 2015-10-20 MED ORDER — FENTANYL CITRATE (PF) 250 MCG/5ML IJ SOLN
INTRAMUSCULAR | Status: AC
  Filled 2015-10-20: qty 5

## 2015-10-20 MED ORDER — LIDOCAINE-EPINEPHRINE 1 %-1:100000 IJ SOLN
INTRAMUSCULAR | Status: DC | PRN
Start: 2015-10-20 — End: 2015-10-20
  Administered 2015-10-20: 20 mL

## 2015-10-20 MED ORDER — OXYCODONE HCL 5 MG PO TABS: 5.0000 mg | ORAL_TABLET | Freq: Once | ORAL | Status: DC | PRN

## 2015-10-20 MED ORDER — CLINDAMYCIN HCL 300 MG PO CAPS: 300.0000 mg | ORAL_CAPSULE | Freq: Three times a day (TID) | ORAL | Status: DC

## 2015-10-20 MED ORDER — ONDANSETRON HCL 4 MG/2ML IJ SOLN
INTRAMUSCULAR | Status: DC | PRN
  Administered 2015-10-20: 4 mg via INTRAVENOUS

## 2015-10-20 MED ORDER — 0.9 % SODIUM CHLORIDE (POUR BTL) OPTIME
TOPICAL | Status: DC | PRN
  Administered 2015-10-20: 1000 mL

## 2015-10-20 MED ORDER — DEXMEDETOMIDINE HCL IN NACL 200 MCG/50ML IV SOLN
INTRAVENOUS | Status: DC | PRN
  Administered 2015-10-20: .5 ug/kg/h via INTRAVENOUS

## 2015-10-20 MED ORDER — OXYCODONE HCL 5 MG/5ML PO SOLN: 5.0000 mg | Freq: Once | ORAL | Status: DC | PRN

## 2015-10-20 MED ORDER — HYDROCODONE-ACETAMINOPHEN 7.5-325 MG PO TABS: 1.0000 | ORAL_TABLET | Freq: Four times a day (QID) | ORAL | Status: DC | PRN

## 2015-10-20 MED ORDER — LACTATED RINGERS IV SOLN
INTRAVENOUS | Status: DC
  Administered 2015-10-20: 08:00:00 via INTRAVENOUS

## 2015-10-20 MED ORDER — CLINDAMYCIN PHOSPHATE 900 MG/50ML IV SOLN
INTRAVENOUS | Status: AC
  Administered 2015-10-20: 20 mg via INTRAVENOUS
  Filled 2015-10-20: qty 50

## 2015-10-20 MED ORDER — FENTANYL CITRATE (PF) 100 MCG/2ML IJ SOLN: 25.0000 ug | INTRAMUSCULAR | Status: DC | PRN

## 2015-10-20 MED ORDER — ONDANSETRON HCL 4 MG/2ML IJ SOLN: 4.0000 mg | Freq: Once | INTRAMUSCULAR | Status: DC | PRN

## 2015-10-20 MED ORDER — PROPOFOL 10 MG/ML IV BOLUS
INTRAVENOUS | Status: DC | PRN
Start: 2015-10-20 — End: 2015-10-20
  Administered 2015-10-20: 20 mg via INTRAVENOUS

## 2015-10-20 MED ORDER — LIDOCAINE-EPINEPHRINE 1 %-1:100000 IJ SOLN
INTRAMUSCULAR | Status: AC
  Filled 2015-10-20: qty 1

## 2015-10-20 MED ORDER — MIDAZOLAM HCL 5 MG/5ML IJ SOLN
INTRAMUSCULAR | Status: DC | PRN
Start: 2015-10-20 — End: 2015-10-20
  Administered 2015-10-20: 2 mg via INTRAVENOUS

## 2015-10-20 MED ORDER — FENTANYL CITRATE (PF) 100 MCG/2ML IJ SOLN
INTRAMUSCULAR | Status: DC | PRN
  Administered 2015-10-20: 50 ug via INTRAVENOUS
  Administered 2015-10-20 (×2): 25 ug via INTRAVENOUS

## 2015-10-20 MED ORDER — MIDAZOLAM HCL 2 MG/2ML IJ SOLN
INTRAMUSCULAR | Status: AC
  Filled 2015-10-20: qty 2

## 2015-10-20 MED ORDER — PROPOFOL 10 MG/ML IV BOLUS
INTRAVENOUS | Status: AC
  Filled 2015-10-20: qty 20

## 2015-10-20 SURGICAL SUPPLY — 30 items
BLADE SURG 15 STRL LF DISP TIS (BLADE) ×1 IMPLANT
BLADE SURG 15 STRL SS (BLADE) ×2
CANISTER SUCTION 2500CC (MISCELLANEOUS) ×3 IMPLANT
COVER SURGICAL LIGHT HANDLE (MISCELLANEOUS) IMPLANT
COVER TABLE BACK 60X90 (DRAPES) ×3 IMPLANT
DRAPE PROXIMA HALF (DRAPES) ×3 IMPLANT
ELECT COATED BLADE 2.86 ST (ELECTRODE) IMPLANT
ELECT REM PT RETURN 9FT ADLT (ELECTROSURGICAL)
ELECTRODE REM PT RTRN 9FT ADLT (ELECTROSURGICAL) IMPLANT
GAUZE SPONGE 4X4 16PLY XRAY LF (GAUZE/BANDAGES/DRESSINGS) ×3 IMPLANT
GLOVE ECLIPSE 7.5 STRL STRAW (GLOVE) ×3 IMPLANT
GLOVE SURG SS PI 7.0 STRL IVOR (GLOVE) ×3 IMPLANT
GOWN SPEC L4 XLG W/TWL (GOWN DISPOSABLE) ×3 IMPLANT
GOWN STRL REUS W/ TWL LRG LVL3 (GOWN DISPOSABLE) ×2 IMPLANT
GOWN STRL REUS W/TWL LRG LVL3 (GOWN DISPOSABLE) ×4
KIT BASIN OR (CUSTOM PROCEDURE TRAY) ×3 IMPLANT
KIT ROOM TURNOVER OR (KITS) ×3 IMPLANT
NEEDLE 27GAX1X1/2 (NEEDLE) ×3 IMPLANT
NS IRRIG 1000ML POUR BTL (IV SOLUTION) ×3 IMPLANT
PAD ARMBOARD 7.5X6 YLW CONV (MISCELLANEOUS) ×3 IMPLANT
PENCIL FOOT CONTROL (ELECTRODE) IMPLANT
SUT CHROMIC 3 0 PS 2 (SUTURE) IMPLANT
SUT CHROMIC 4 0 PS 2 18 (SUTURE) IMPLANT
SUT VICRYL 4-0 PS2 18IN ABS (SUTURE) IMPLANT
SYR BULB 3OZ (MISCELLANEOUS) ×3 IMPLANT
SYR CONTROL 10ML LL (SYRINGE) ×3 IMPLANT
TOWEL OR 17X24 6PK STRL BLUE (TOWEL DISPOSABLE) ×3 IMPLANT
TUBE CONNECTING 12'X1/4 (SUCTIONS) ×1
TUBE CONNECTING 12X1/4 (SUCTIONS) ×2 IMPLANT
YANKAUER SUCT BULB TIP NO VENT (SUCTIONS) ×3 IMPLANT

## 2015-10-20 NOTE — Anesthesia Postprocedure Evaluation (Signed)
Anesthesia Post Note  Patient: Luke Cook  Procedure(s) Performed: Procedure(s) (LRB): REMOVAL MANDIBULOMAXILLARY FIXATION  (N/A)  Patient location during evaluation: PACU Anesthesia Type: MAC Level of consciousness: awake, awake and alert and oriented Pain management: pain level controlled Vital Signs Assessment: post-procedure vital signs reviewed and stable Respiratory status: spontaneous breathing, nonlabored ventilation and respiratory function stable Cardiovascular status: blood pressure returned to baseline Anesthetic complications: no    Last Vitals:  Filed Vitals:   10/20/15 1010 10/20/15 1030  BP: 109/63 132/66  Pulse: 37   Temp: 36.6 C   Resp: 14 16    Last Pain: There were no vitals filed for this visit.               Raychell Holcomb COKER

## 2015-10-20 NOTE — H&P (View-Only) (Signed)
  Progress Notes    Here for followup after mandible fracture treatment. Doing well on liquid diet. Occlusion appears to be intact. There is no swelling around mandible. There is no numbness or tingling on either side. MMF is in place and stable. Progressing nicely. Recommend panorax now, MMF removal in 2 weeks.

## 2015-10-20 NOTE — Op Note (Signed)
OPERATIVE REPORT  DATE OF SURGERY: 10/20/2015  PATIENT:  Luke Cook,  60 y.o. male  PRE-OPERATIVE DIAGNOSIS:  OPEN FRACTURE OF RIGHT RAMUS OF MANDIBLE   POST-OPERATIVE DIAGNOSIS:  OPEN FRACTURE OF RIGHT RAMUS OF MANDIBLE   PROCEDURE:  Procedure(s): REMOVAL MANDIBULOMAXILLARY FIXATION   SURGEON:  Susy FrizzleJefry H Kemond Amorin, MD  ASSISTANTS: none  ANESTHESIA:   General   EBL:  10 ml  DRAINS: none  LOCAL MEDICATIONS USED:  1% Xylocaine with epinephrine  SPECIMEN:  none  COUNTS:  Correct  PROCEDURE DETAILS: The patient was taken to the operating room and placed on the operating table in the supine position. The MMF wires were cut and removed. Following induction of intravenous sedation, local anesthetic was infiltrated along the upper and lower gingival mucosa. All of the screws were identified, uncovered by mucosal overgrowth and removed with screwdriver. The upper and lower arch bar were then completely removed. The oral cavity was irrigated with saline and suctioned. The patient was awakened from sedation and transferred to recovery in stable condition.    PATIENT DISPOSITION:  To PACU, stable

## 2015-10-20 NOTE — Discharge Instructions (Signed)
Rinse mouth with salt water 3 times daily.  Only soft foods for the next two weekd, then resume normal diet.  Follow up with your dentist.

## 2015-10-20 NOTE — Transfer of Care (Signed)
Immediate Anesthesia Transfer of Care Note  Patient: Luke Cook  Procedure(s) Performed: Procedure(s): REMOVAL MANDIBULOMAXILLARY FIXATION  (N/A)  Patient Location: PACU  Anesthesia Type:MAC  Level of Consciousness: sedated, patient cooperative and responds to stimulation  Airway & Oxygen Therapy: Patient Spontanous Breathing  Post-op Assessment: Report given to RN and Post -op Vital signs reviewed and stable  Post vital signs: Reviewed and stable  Last Vitals:  Filed Vitals:   10/20/15 0649 10/20/15 0813  BP: 175/72 167/66  Pulse:  56  Temp:    Resp:      Last Pain: There were no vitals filed for this visit.    Patients Stated Pain Goal: 3 (10/20/15 0752)  Complications: No apparent anesthesia complications

## 2015-10-20 NOTE — Anesthesia Preprocedure Evaluation (Signed)
Anesthesia Evaluation  Patient identified by MRN, date of birth, ID band Patient awake    Reviewed: Allergy & Precautions, NPO status , Patient's Chart, lab work & pertinent test results  Airway    Neck ROM: Full  Mouth opening: Limited Mouth Opening  Dental  (+) Dental Advisory Given   Pulmonary Current Smoker,    breath sounds clear to auscultation       Cardiovascular hypertension,  Rhythm:Regular Rate:Normal     Neuro/Psych    GI/Hepatic   Endo/Other    Renal/GU      Musculoskeletal   Abdominal   Peds  Hematology   Anesthesia Other Findings   Reproductive/Obstetrics                             Anesthesia Physical Anesthesia Plan  ASA: III  Anesthesia Plan: MAC   Post-op Pain Management:    Induction: Intravenous  Airway Management Planned: Natural Airway and Simple Face Mask  Additional Equipment:   Intra-op Plan:   Post-operative Plan:   Informed Consent: I have reviewed the patients History and Physical, chart, labs and discussed the procedure including the risks, benefits and alternatives for the proposed anesthesia with the patient or authorized representative who has indicated his/her understanding and acceptance.     Plan Discussed with: CRNA and Anesthesiologist  Anesthesia Plan Comments:         Anesthesia Quick Evaluation

## 2015-10-20 NOTE — Interval H&P Note (Signed)
History and Physical Interval Note:  10/20/2015 8:38 AM  Luke Cook  has presented today for surgery, with the diagnosis of OPEN FRACTURE OF RIGHT RAMUS OF MANDIBLE   The various methods of treatment have been discussed with the patient and family. After consideration of risks, benefits and other options for treatment, the patient has consented to  Procedure(s): REMOVAL MANDIBULOMAXILLARY FIXATION  (N/A) as a surgical intervention .  The patient's history has been reviewed, patient examined, no change in status, stable for surgery.  I have reviewed the patient's chart and labs.  Questions were answered to the patient's satisfaction.     Celicia Minahan

## 2015-10-20 NOTE — Progress Notes (Signed)
Spoke with Dr. Noreene LarssonJoslin regarding pt HR, 56 bpm. Pt has not received lopressor today, per record pt takes 100mg  suspension twice a day. [facility] states patient takes medicine crushed in water or oral suspension. Per Dr. Noreene LarssonJoslin, metoprolol will be given IV in OR.

## 2015-10-21 ENCOUNTER — Non-Acute Institutional Stay (SKILLED_NURSING_FACILITY): Payer: Medicaid Other | Admitting: Internal Medicine

## 2015-10-21 ENCOUNTER — Encounter: Payer: Self-pay | Admitting: Internal Medicine

## 2015-10-21 DIAGNOSIS — S2243XS Multiple fractures of ribs, bilateral, sequela: Secondary | ICD-10-CM

## 2015-10-21 DIAGNOSIS — S02609S Fracture of mandible, unspecified, sequela: Secondary | ICD-10-CM | POA: Diagnosis not present

## 2015-10-21 DIAGNOSIS — S62121A Displaced fracture of lunate [semilunar], right wrist, initial encounter for closed fracture: Secondary | ICD-10-CM

## 2015-10-21 DIAGNOSIS — I110 Hypertensive heart disease with heart failure: Secondary | ICD-10-CM | POA: Diagnosis not present

## 2015-10-21 DIAGNOSIS — I7101 Dissection of thoracic aorta: Secondary | ICD-10-CM | POA: Diagnosis not present

## 2015-10-21 DIAGNOSIS — D6489 Other specified anemias: Secondary | ICD-10-CM | POA: Diagnosis not present

## 2015-10-21 DIAGNOSIS — I71019 Dissection of thoracic aorta, unspecified: Secondary | ICD-10-CM

## 2015-10-21 NOTE — Progress Notes (Signed)
Patient ID: Luke Cook, male   DOB: June 21, 1955, 60 y.o.   MRN: [id number]     DATE: 10/21/15  Location:  Nursing Home Location: [place] Room Number: 145 B Place of Service: SNF (31)   Extended Emergency Contact Information Primary Emergency Contact: None,Given  [place] Phone: [phone] Relation: None  Advanced Directive information Does patient have an advance directive?: No, Would patient like information on creating an advanced directive?: No - patient declined information FULL CODE Chief Complaint  Patient presents with  . Medical Management of Chronic Issues    Routine Visit    HPI:  60 yo male short term resident seen today for f/u. He saw ENT and had some wires removed from jaw. He is able to open mouth better but still has difficulty. He notes pain in left jaw with chewing and he is using right side to chew. He also c/o drooling and has numbness in lower lip. He notes hard nodules in lower lip. No other concerns but would like to advance diet if possible. He is tolerating PT/OT. No nursing issues. No falls. He still has swelling anterior right knee and rib pain with movement but improving. Right wrist pain but stable as long as he wears brace  Hypertensive heart disease with CHF  - had run of Vtach in the hospital. Takes lopressor 100 mg twice daily and cardizem 60 mg three times daily   Aortic dissection -  24 mm in length. Takes lopressor 100 mg twice daily and cardizem 60 mg three times daily; apresoline crushed and and place in fluids. Followed by cardio Dr Meda Coffee  Anemia - hgb is improved at 10.1  Aspirated 8 mm metallic foregin body in right lung -  may be a tooth cap; will need to follow up with pulmonology   Mandible fracture/multiple rib fx/right wrist fx - s/p mandible ORIF and some wires removed at last visit with Dr Constance Holster. He is on full liquid diet. Takes hycet solution 7.5/325 per 15 mL; 10 mL  every 6 hours as needed for pain  Past Medical History  Diagnosis Date  . Hypertension   . Mandible fracture ([place])   . Fracture, rib     per pt report  . CHF (congestive heart failure) ([place])   . Wrist fracture, right   . Anemia     Past Surgical History  Procedure Laterality Date  . Orif mandibular fracture N/A 09/14/2015    Procedure: OPEN REDUCTION INTERNAL FIXATION (ORIF) MANDIBULAR FRACTURE AND MAXILLOMANDIBULAR FIXATION AND OPEN REDUCTION INTERNAL FIXATION ALVEOLAR FRACTURE;  Surgeon: Izora Gala, MD;  Location: [facility];  Service: ENT;  Laterality: N/A;  . Facial laceration repair N/A 09/14/2015    Procedure: CHIN LACERATION REPAIR;  Surgeon: Izora Gala, MD;  Location: [facility];  Service: ENT;  Laterality: N/A;    No care team member to display  Social History   Social History  . Marital Status: Divorced    Spouse Name: N/A  . Number of Children: N/A  . Years of Education: N/A   Occupational History  . Not on file.   Social History Main Topics  . Smoking status: Current Every Day Smoker  . Smokeless tobacco: Not on file     Comment: quit 2 months ago (May 2017)  . Alcohol Use: Yes  . Drug Use: Yes  . Sexual Activity: Not on file   Other Topics Concern  . Not on file  Social History Narrative     reports that he has been smoking.  He does not have any smokeless tobacco history on file. He reports that he drinks alcohol. He reports that he uses illicit drugs.  Family History  Problem Relation Age of Onset  . Heart attack Mother    Family Status  Relation Status Death Age  . Mother Alive     Immunization History  Administered Date(s) Administered  . Pneumococcal Polysaccharide-23 09/15/2015  . Tdap 09/13/2015    Allergies  Allergen Reactions  . Tomato Rash    Medications: Patient's Medications  New Prescriptions   No medications on file  Previous Medications   AMINO ACIDS-PROTEIN HYDROLYS (FEEDING SUPPLEMENT, PRO-STAT SUGAR FREE 64,) LIQD     Take 90 mLs by mouth 2 (two) times daily.   ASPIRIN 325 MG TABLET    Take 650 mg by mouth daily as needed for headache.    CLINDAMYCIN (CLEOCIN) 75 MG/5ML SOLUTION    Take 600 mg by mouth 3 (three) times daily.   DILTIAZEM (CARDIZEM) 60 MG TABLET    Take 1 tablet (60 mg total) by mouth 3 (three) times daily.   HYDRALAZINE (APRESOLINE) 25 MG TABLET    Take 25 mg by mouth 3 (three) times daily.   HYDROCODONE-ACETAMINOPHEN (HYCET) 7.5-325 MG/15 ML SOLUTION    Give 15 ml by mouth every 4 hours as needed for mild pain, Give 30 ml by mouth every 4 hours as needed for severe pain   METOPROLOL TARTRATE (LOPRESSOR) 25 MG/10 ML SUSP    Take 40 mLs (100 mg total) by mouth 2 (two) times daily.   NON FORMULARY    Med Pass 60 cc by mouth three times daily  Modified Medications   No medications on file  Discontinued Medications   CLINDAMYCIN (CLEOCIN) 300 MG CAPSULE    Take 1 capsule (300 mg total) by mouth 3 (three) times daily.   HYDROCODONE-ACETAMINOPHEN (HYCET) 7.5-325 MG/15 ML SOLUTION    Take 10-20 mLs by mouth every 6 (six) hours as needed for moderate pain (39m for mild pain, 137mfor moderate pain, 2057mor severe pain).   HYDROCODONE-ACETAMINOPHEN (NORCO) 7.5-325 MG TABLET    Take 1 tablet by mouth every 6 (six) hours as needed for moderate pain.    Review of Systems  HENT: Positive for dental problem (pain om left mouth with chewing) and drooling. Negative for trouble swallowing.   Musculoskeletal: Positive for joint swelling (right knee), arthralgias and gait problem.  Neurological: Positive for numbness.  All other systems reviewed and are negative.   Filed Vitals:   10/21/15 1210  BP: 135/61  Pulse: 58  Temp: 97.3 F (36.3 C)  TempSrc: Oral  Resp: 16  Height: 5' 10"  (1.778 m)  Weight: 164 lb 3.2 oz (74.481 kg)  SpO2: 99%   Body mass index is 23.56 kg/(m^2).  Physical Exam  Constitutional: He is oriented to person, place, and time. He appears well-developed.  Sitting on edge of  bed in NAD, frail appearing  HENT:  Better aperture of mouth but still small opening. Left mandible and maxilla swelling with TTP. No tongue lesions. Unable to fully examine oropharynx due to reduced ability to open mouth. L>R TMJ TTP  Eyes: Pupils are equal, round, and reactive to light. No scleral icterus.  Neck: Neck supple. Carotid bruit is not present. No thyromegaly present.  Cardiovascular: Regular rhythm, normal heart sounds and intact distal pulses.  Tachycardia present.  Exam reveals no gallop and no  friction rub.   No murmur heard. no distal LE swelling. No calf TTP  Pulmonary/Chest: Effort normal and breath sounds normal. He has no wheezes. He has no rales. He exhibits no tenderness.  Abdominal: Soft. Bowel sounds are normal. He exhibits no distension, no abdominal bruit, no pulsatile midline mass and no mass. There is no tenderness. There is no rebound and no guarding.  Musculoskeletal: He exhibits edema and tenderness (ACW/ribs, jaw b/l).  Right knee medial swelling; ROM intact. Right wrist/hand brace intact  Lymphadenopathy:    He has no cervical adenopathy.  Neurological: He is alert and oriented to person, place, and time.  Skin: Skin is warm and dry. No rash noted.  Multiple contusions (including right knee; left shoulder); abrasions (under chin, left > right elbow)  Psychiatric: He has a normal mood and affect. His behavior is normal.     Labs reviewed: Nursing Home on 10/21/2015  Component Date Value Ref Range Status  . Hemoglobin 09/24/2015 9.1* 13.5 - 17.5 g/dL Final  . HCT 09/24/2015 28* 41 - 53 % Final  . Platelets 09/24/2015 619* 150 - 399 K/L Final  . WBC 09/24/2015 7.5   Final  . Glucose 09/24/2015 123   Final  . BUN 09/24/2015 15  4 - 21 mg/dL Final  . Creatinine 09/24/2015 1.0  0.6 - 1.3 mg/dL Final  . Potassium 09/24/2015 5.2  3.4 - 5.3 mmol/L Final  . Sodium 09/24/2015 138  137 - 147 mmol/L Final  . Alkaline Phosphatase 09/24/2015 56  25 - 125 U/L  Final  . ALT 09/24/2015 15  10 - 40 U/L Final  . AST 09/24/2015 15  14 - 40 U/L Final  . Bilirubin, Total 09/24/2015 0.5   Final  Admission on 10/20/2015, Discharged on 10/20/2015  Component Date Value Ref Range Status  . WBC 10/20/2015 4.7  4.0 - 10.5 K/uL Final  . RBC 10/20/2015 3.62* 4.22 - 5.81 MIL/uL Final  . Hemoglobin 10/20/2015 10.1* 13.0 - 17.0 g/dL Final  . HCT 10/20/2015 32.3* 39.0 - 52.0 % Final  . MCV 10/20/2015 89.2  78.0 - 100.0 fL Final  . MCH 10/20/2015 27.9  26.0 - 34.0 pg Final  . MCHC 10/20/2015 31.3  30.0 - 36.0 g/dL Final  . RDW 10/20/2015 12.8  11.5 - 15.5 % Final  . Platelets 10/20/2015 255  150 - 400 K/uL Final  . Sodium 10/20/2015 137  135 - 145 mmol/L Final  . Potassium 10/20/2015 3.7  3.5 - 5.1 mmol/L Final  . Chloride 10/20/2015 104  101 - 111 mmol/L Final  . CO2 10/20/2015 28  22 - 32 mmol/L Final  . Glucose, Bld 10/20/2015 104* 65 - 99 mg/dL Final  . BUN 10/20/2015 29* 6 - 20 mg/dL Final  . Creatinine, Ser 10/20/2015 1.12  0.61 - 1.24 mg/dL Final  . Calcium 10/20/2015 8.8* 8.9 - 10.3 mg/dL Final  . GFR calc non Af Amer 10/20/2015 >60  >60 mL/min Final  . GFR calc Af Amer 10/20/2015 >60  >60 mL/min Final   Comment: (NOTE) The eGFR has been calculated using the CKD EPI equation. This calculation has not been validated in all clinical situations. eGFR's persistently <60 mL/min signify possible Chronic Kidney Disease.   . Anion gap 10/20/2015 5  5 - 15 Final  Admission on 09/13/2015, Discharged on 09/19/2015  No results displayed because visit has over 200 results.      Dg Orthopantogram  10/15/2015  CLINICAL DATA:  Open fracture of the right  mandible. EXAM: ORTHOPANTOGRAM/PANORAMIC COMPARISON:  CT of the mandible 09/13/2015. FINDINGS: Malleable plate and screw fixation is present across anterior aspect the mandible. There is a second mobile plate just the left of the anterior genu. The mandible is wired closed. Gross alignment is anatomic. The  remaining teeth are intact. IMPRESSION: 1. ORIF of the mandible with gross anatomic alignment and no evidence for complication. Electronically Signed   By: San Morelle M.D.   On: 10/15/2015 14:02     Assessment/Plan   ICD-9-CM ICD-10-CM   1. Closed fracture of mandible, unspecified mandibular site, sequela ([place]) 802.20 S02.609S   2. Right lunate fracture, closed, initial encounter 814.02 S62.121A   3. Hypertensive heart disease with CHF (congestive heart failure) (H[facility]) 402.91 I11.0    428.0    4. Multiple fractures of ribs of both sides, sequela 905.1 S22.43XS   5. Anemia due to other cause 285.8 D64.89    improving  6. Aortic dissection distal to left subclavian ([facility]) 441.00 I71.01     Send stool hemoccult x 3 for colon cancer screening  Verify with ENT Dr Constance Holster whether it is safe to advance diet as tolerated. May need to get ST involved also  F/u with ENT as scheduled  Close observation of lower lip cystic vs nodular areas  cont current meds as ordered  PT/OT/ST as ordered  Will follow  Hartford Maulden S. Perlie Gold  [facility] and [facility] [address] [place], [place] [address] [phone] Cell (Monday-Friday 8 AM - 5 PM) [phone] After 5 PM and follow prompts

## 2015-10-23 ENCOUNTER — Non-Acute Institutional Stay (SKILLED_NURSING_FACILITY): Payer: Medicaid Other | Admitting: Adult Health

## 2015-10-23 ENCOUNTER — Encounter: Payer: Self-pay | Admitting: Adult Health

## 2015-10-23 DIAGNOSIS — S2243XS Multiple fractures of ribs, bilateral, sequela: Secondary | ICD-10-CM

## 2015-10-23 DIAGNOSIS — S02609S Fracture of mandible, unspecified, sequela: Secondary | ICD-10-CM

## 2015-10-23 DIAGNOSIS — I110 Hypertensive heart disease with heart failure: Secondary | ICD-10-CM
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
## 2015-11-07 ENCOUNTER — Telehealth: Payer: Self-pay | Admitting: Surgery

## 2015-11-07 NOTE — Telephone Encounter (Signed)
Sched CTA 9/8 at 9:00 at [address] [facility]. Sched MD 9/25 at 11:15. Tried to speak to pt; the phone kept cutting in and out. Mailed CT instructions.

## 2015-11-07 NOTE — Telephone Encounter (Signed)
-----   Message from Sharee Pimple, RN sent at 09/17/2015  4:49 PM EDT ----- Regarding: schedule   ----- Message -----    From: Nada Libman, MD    Sent: 09/16/2015   5:03 PM      To: [facility] Charge Pool  Schedule follow up in 3 months with CTA chest abdomen and pelvis to follow up chronic dissection

## 2015-12-22 ENCOUNTER — Ambulatory Visit
Admission: RE | Admit: 2015-12-22 | Discharge: 2015-12-22 | Disposition: A | Discharge status: Home / Self Care | Payer: No Typology Code available for payment source | Source: Ambulatory Visit | Attending: Surgery | Admitting: Surgery

## 2015-12-22 ENCOUNTER — Other Ambulatory Visit: Payer: Self-pay

## 2015-12-22 DIAGNOSIS — I7103 Dissection of thoracoabdominal aorta: Secondary | ICD-10-CM

## 2015-12-22 MED ORDER — IOPAMIDOL (ISOVUE-370) INJECTION 76%
80.0000 mL | Freq: Once | INTRAVENOUS | Status: AC | PRN
  Administered 2015-12-22: 80 mL via INTRAVENOUS

## 2015-12-23 ENCOUNTER — Encounter: Payer: Self-pay | Admitting: Surgery

## 2015-12-29 ENCOUNTER — Encounter: Payer: Self-pay | Admitting: Surgery

## 2015-12-29 ENCOUNTER — Ambulatory Visit (INDEPENDENT_AMBULATORY_CARE_PROVIDER_SITE_OTHER): Payer: Self-pay | Admitting: Surgery

## 2015-12-29 VITALS — BP 150/94 | HR 90 | Resp 16 | Ht 70.0 in | Wt 164.0 lb

## 2015-12-29 DIAGNOSIS — I7101 Dissection of thoracic aorta: Secondary | ICD-10-CM

## 2015-12-29 DIAGNOSIS — I71019 Dissection of thoracic aorta, unspecified: Secondary | ICD-10-CM
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
## 2015-12-30 ENCOUNTER — Telehealth: Payer: Self-pay | Admitting: *Deleted

## 2015-12-30 NOTE — Telephone Encounter (Signed)
Pt not available at time of Pre-Visit Call. He will try to call back later if he is able, but will plan to arrive 15 minutes early for appt.

## 2015-12-31 ENCOUNTER — Ambulatory Visit (INDEPENDENT_AMBULATORY_CARE_PROVIDER_SITE_OTHER): Payer: Self-pay | Admitting: Family Medicine

## 2015-12-31 ENCOUNTER — Encounter: Payer: Self-pay | Admitting: Family Medicine

## 2015-12-31 VITALS — BP 150/70 | HR 78 | Temp 98.3°F | Ht 70.0 in | Wt 161.8 lb

## 2015-12-31 DIAGNOSIS — Z13220 Encounter for screening for lipoid disorders: Secondary | ICD-10-CM

## 2015-12-31 DIAGNOSIS — I10 Essential (primary) hypertension: Secondary | ICD-10-CM

## 2015-12-31 DIAGNOSIS — Z720 Tobacco use: Secondary | ICD-10-CM

## 2015-12-31 DIAGNOSIS — R0600 Dyspnea, unspecified: Secondary | ICD-10-CM

## 2015-12-31 LAB — MICROALBUMIN / CREATININE URINE RATIO
Creatinine,U: 250.5 mg/dL
MICROALB UR: 9 mg/dL — AB (ref 0.0–1.9)
MICROALB/CREAT RATIO: 3.6 mg/g (ref 0.0–30.0)

## 2015-12-31 LAB — LIPID PANEL
CHOLESTEROL: 160 mg/dL (ref 0–200)
HDL: 58.7 mg/dL (ref >39.00)
LDL Cholesterol: 78 mg/dL (ref 0–99)
NONHDL: 101
Total CHOL/HDL Ratio: 3
Triglycerides: 113 mg/dL (ref 0.0–149.0)
VLDL: 22.6 mg/dL (ref 0.0–40.0)

## 2015-12-31 LAB — BASIC METABOLIC PANEL
BUN: 18 mg/dL (ref 6–23)
CALCIUM: 8.6 mg/dL (ref 8.4–10.5)
CO2: 30 mEq/L (ref 19–32)
Chloride: 105 mEq/L (ref 96–112)
Creatinine, Ser: 1.11 mg/dL (ref 0.40–1.50)
GFR: 86.68 mL/min (ref >60.00)
GLUCOSE: 84 mg/dL (ref 70–99)
Potassium: 3.9 mEq/L (ref 3.5–5.1)
SODIUM: 141 meq/L (ref 135–145)

## 2015-12-31 MED ORDER — LISINOPRIL-HYDROCHLOROTHIAZIDE 20-12.5 MG PO TABS: 1.0000 | ORAL_TABLET | Freq: Every day | ORAL | 1 refills | Status: AC

## 2015-12-31 NOTE — Patient Instructions (Addendum)
Stop smoking.  Come back in 1 week for a lab draw.  Come back in 4 weeks for a follow up blood pressure visit.

## 2015-12-31 NOTE — Progress Notes (Signed)
Chief Complaint  Patient presents with  . Establish Care    pt want to discuss elevated BP       New Patient Visit SUBJECTIVE: HPI: Luke Cook is an 60 y.o.male who is being seen for establishing care. Here with his sister.  The patient has not had a PCP in quite some time.  Hypertension Patient presents for hypertension follow up. He does monitor home blood pressures. He is Not currently on any medications. He is adhering to a low sodium and low fat diet. He exercises rarely.  Dyspnea The patient tends to get fatigue can have shortness of breath when he lies down and when he physically exerts himself. No associated cough. He denies having a history of heart failure. He does smoke when he is able. Due to financial insurance, he has not been able to afford them recently.  Allergies  Allergen Reactions  . Tomato Rash    Past Medical History:  Diagnosis Date  . Alcohol abuse   . Anemia   . Depression   . Drug abuse   . Fracture, rib    per pt report  . Hypertension   . Mandible fracture ([facility])   . Wrist fracture, right    Past Surgical History:  Procedure Laterality Date  . FACIAL LACERATION REPAIR N/A 09/14/2015   Procedure: CHIN LACERATION REPAIR;  Surgeon: Serena ColonelJefry Rosen, MD;  [facility];  Service: ENT;  Laterality: N/A;  . MANDIBULAR HARDWARE REMOVAL N/A 10/20/2015   Procedure: REMOVAL MANDIBULOMAXILLARY FIXATION ;  Surgeon: Serena ColonelJefry Rosen, MD;  [facility];  Service: ENT;  Laterality: N/A;  . ORIF MANDIBULAR FRACTURE N/A 09/14/2015   Procedure: OPEN REDUCTION INTERNAL FIXATION (ORIF) MANDIBULAR FRACTURE AND MAXILLOMANDIBULAR FIXATION AND OPEN REDUCTION INTERNAL FIXATION ALVEOLAR FRACTURE;  Surgeon: Serena ColonelJefry Rosen, MD;  Location[facility] OR;  Service: ENT;  Laterality: N/A;   Social History   Social History  . Marital status: Divorced    Spouse name: N/A  . Number of children: N/A  . Years of education: N/A   Occupational History  . Not on file.   Social History  Main Topics  . Smoking status: Current Every Day Smoker  . Smokeless tobacco: Never Used     Comment: quit 2 months ago (May 2017)  . Alcohol use Yes  . Drug use:   . Sexual activity: Not on file   Other Topics Concern  . Not on file   Social History Narrative  . No narrative on file   Family History  Problem Relation Age of Onset  . Hypertension Mother   . Heart disease Mother   . Dementia Mother   . Thyroid disease Mother   . Gallstones Mother   . Arthritis Mother   . Gout Mother   . Kidney Stones Mother   . Diabetes Sister   . Hypertension Sister   . Heart disease Sister   . Crohn's disease Sister   . Gallstones Sister   . Stroke Sister   . Sarcoidosis Brother   . Cancer Maternal Aunt     Colon  . Hypertension Maternal Aunt   . Heart disease Maternal Aunt   . Congestive Heart Failure Maternal Aunt   . Stroke Maternal Grandmother      Current Outpatient Prescriptions:  .  ASPIRIN PO, Take by mouth., Disp: , Rfl:  .  lisinopril-hydrochlorothiazide (PRINZIDE,ZESTORETIC) 20-12.5 MG tablet, Take 1 tablet by mouth daily., Disp: 90 tablet, Rfl: 1  ROS Cardiovascular: Denies chest pain  Respiratory: +dyspnea   OBJECTIVE: BP (!) 150/70 (BP Location: Left Arm, Patient Position: Sitting, Cuff Size: Normal)   Pulse 78   Temp 98.3 F (36.8 C) (Oral)   Ht 5\' 10"  (1.778 m)   Wt 161 lb 12.8 oz (73.4 kg)   SpO2 98%   BMI 23.22 kg/m   Constitutional: -  VS reviewed -  Well developed, well nourished, appears stated age -  No apparent distress  Psychiatric: -  Oriented to person, place, and time -  Memory intact -  Affect and mood normal -  Fluent conversation, good eye contact -  Judgment and insight age appropriate  Eye: -  Conjunctivae clear, no discharge -  Pupils symmetric, round, reactive to light  ENMT: -  Ears are patent b/l without erythema or discharge. TM's are shiny and clear b/l without evidence of effusion or infection. -  Oral mucosa without  lesions, tongue and uvula midline    Tonsils not enlarged, no erythema, no exudate, trachea midline    Pharynx moist, no lesions, no erythema  Neck: -  No gross swelling, no palpable masses -  Thyroid midline, not enlarged, mobile, no palpable masses  Cardiovascular: -  RRR, no murmurs -  No LE edema  Respiratory: -  Normal respiratory effort, no accessory muscle use, no retraction -  Breath sounds equal, no wheezes, no ronchi, no crackles  Skin: -  No significant lesion on inspection -  Warm and dry to palpation   ASSESSMENT/PLAN: Essential hypertension - Plan: ECHOCARDIOGRAM COMPLETE, lisinopril-hydrochlorothiazide (PRINZIDE,ZESTORETIC) 20-12.5 MG tablet, Basic Metabolic Panel (BMET), Microalbumin / creatinine urine ratio  Dyspnea - Plan: ECHOCARDIOGRAM COMPLETE  Tobacco abuse  Screening, lipid - Plan: Lipid Profile  Orders as above. Needs to stop smoking. Patient says he will. Follow-up in 1 week for lab draw. Echo to evaluate his long-standing uncontrolled hypertension and possible contribution to his dyspnea. If no improvement, will obtain pulmonary function tests and a chest x-ray. Patient should return in 4 weeks. The patient voiced understanding and agreement to the plan.   Jilda Roche [place], DO 12/31/15  10:46 AM

## 2015-12-31 NOTE — Progress Notes (Signed)
Pre visit review using our clinic review tool, if applicable. No additional management support is needed unless otherwise documented below in the visit note. 

## 2016-01-07 ENCOUNTER — Other Ambulatory Visit: Payer: Self-pay

## 2016-01-14 ENCOUNTER — Ambulatory Visit (HOSPITAL_BASED_OUTPATIENT_CLINIC_OR_DEPARTMENT_OTHER): Admission: RE | Admit: 2016-01-14 | Payer: Self-pay | Source: Ambulatory Visit

## 2016-01-28 DIAGNOSIS — Z0279 Encounter for issue of other medical certificate: Secondary | ICD-10-CM | POA: Diagnosis not present

## 2016-01-29 ENCOUNTER — Encounter: Payer: Self-pay | Admitting: Family Medicine

## 2016-03-04 NOTE — Addendum Note (Signed)
Addended by: Burton ApleyPETTY, Kalayah Leske A on: 03/04/2016 08:27 AM   Modules accepted: Orders
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
## 2016-07-04 DEATH — deceased

## 2017-01-03 ENCOUNTER — Ambulatory Visit: Payer: Self-pay | Admitting: Surgery
